# Patient Record
Sex: Female | Born: 1935 | Race: Black or African American | Hispanic: No | Smoking: Former smoker
Health system: Southern US, Community
[De-identification: ages and names within clinical notes are randomized; demographics above are authoritative.]

## PROBLEM LIST (undated history)

## (undated) DIAGNOSIS — F0390 Unspecified dementia without behavioral disturbance: Secondary | ICD-10-CM

## (undated) DIAGNOSIS — I639 Cerebral infarction, unspecified: Secondary | ICD-10-CM

---

## 2016-03-19 ENCOUNTER — Inpatient Hospital Stay (HOSPITAL_COMMUNITY): Payer: Medicare HMO

## 2016-03-19 ENCOUNTER — Emergency Department (HOSPITAL_COMMUNITY): Payer: Medicare HMO

## 2016-03-19 ENCOUNTER — Inpatient Hospital Stay (HOSPITAL_COMMUNITY)
Admission: EM | Admit: 2016-03-19 | Discharge: 2016-03-23 | DRG: 064 | Disposition: A | Discharge status: Hospice | Payer: Medicare HMO | Attending: Family Medicine | Admitting: Family Medicine

## 2016-03-19 DIAGNOSIS — J439 Emphysema, unspecified: Secondary | ICD-10-CM | POA: Diagnosis present

## 2016-03-19 DIAGNOSIS — I459 Conduction disorder, unspecified: Secondary | ICD-10-CM | POA: Diagnosis present

## 2016-03-19 DIAGNOSIS — N390 Urinary tract infection, site not specified: Secondary | ICD-10-CM | POA: Diagnosis present

## 2016-03-19 DIAGNOSIS — I10 Essential (primary) hypertension: Secondary | ICD-10-CM | POA: Diagnosis present

## 2016-03-19 DIAGNOSIS — Z681 Body mass index (BMI) 19 or less, adult: Secondary | ICD-10-CM

## 2016-03-19 DIAGNOSIS — I671 Cerebral aneurysm, nonruptured: Secondary | ICD-10-CM | POA: Diagnosis present

## 2016-03-19 DIAGNOSIS — Z515 Encounter for palliative care: Secondary | ICD-10-CM | POA: Diagnosis present

## 2016-03-19 DIAGNOSIS — R64 Cachexia: Secondary | ICD-10-CM | POA: Diagnosis present

## 2016-03-19 DIAGNOSIS — E43 Unspecified severe protein-calorie malnutrition: Secondary | ICD-10-CM | POA: Diagnosis present

## 2016-03-19 DIAGNOSIS — R05 Cough: Secondary | ICD-10-CM

## 2016-03-19 DIAGNOSIS — I739 Peripheral vascular disease, unspecified: Secondary | ICD-10-CM | POA: Diagnosis present

## 2016-03-19 DIAGNOSIS — G300 Alzheimer's disease with early onset: Secondary | ICD-10-CM

## 2016-03-19 DIAGNOSIS — E785 Hyperlipidemia, unspecified: Secondary | ICD-10-CM | POA: Diagnosis not present

## 2016-03-19 DIAGNOSIS — R1313 Dysphagia, pharyngeal phase: Secondary | ICD-10-CM | POA: Diagnosis present

## 2016-03-19 DIAGNOSIS — R531 Weakness: Secondary | ICD-10-CM

## 2016-03-19 DIAGNOSIS — Z7401 Bed confinement status: Secondary | ICD-10-CM

## 2016-03-19 DIAGNOSIS — E876 Hypokalemia: Secondary | ICD-10-CM | POA: Diagnosis present

## 2016-03-19 DIAGNOSIS — R29810 Facial weakness: Secondary | ICD-10-CM | POA: Diagnosis present

## 2016-03-19 DIAGNOSIS — Z8249 Family history of ischemic heart disease and other diseases of the circulatory system: Secondary | ICD-10-CM | POA: Diagnosis not present

## 2016-03-19 DIAGNOSIS — G309 Alzheimer's disease, unspecified: Secondary | ICD-10-CM | POA: Diagnosis present

## 2016-03-19 DIAGNOSIS — I63311 Cerebral infarction due to thrombosis of right middle cerebral artery: Secondary | ICD-10-CM | POA: Diagnosis not present

## 2016-03-19 DIAGNOSIS — F17210 Nicotine dependence, cigarettes, uncomplicated: Secondary | ICD-10-CM | POA: Diagnosis present

## 2016-03-19 DIAGNOSIS — G8194 Hemiplegia, unspecified affecting left nondominant side: Secondary | ICD-10-CM | POA: Diagnosis present

## 2016-03-19 DIAGNOSIS — Z66 Do not resuscitate: Secondary | ICD-10-CM | POA: Diagnosis present

## 2016-03-19 DIAGNOSIS — I639 Cerebral infarction, unspecified: Secondary | ICD-10-CM | POA: Diagnosis present

## 2016-03-19 DIAGNOSIS — I672 Cerebral atherosclerosis: Secondary | ICD-10-CM | POA: Diagnosis present

## 2016-03-19 DIAGNOSIS — R059 Cough, unspecified: Secondary | ICD-10-CM

## 2016-03-19 DIAGNOSIS — F0280 Dementia in other diseases classified elsewhere without behavioral disturbance: Secondary | ICD-10-CM | POA: Diagnosis present

## 2016-03-19 HISTORY — DX: Cerebral infarction, unspecified: I63.9

## 2016-03-19 HISTORY — DX: Unspecified dementia, unspecified severity, without behavioral disturbance, psychotic disturbance, mood disturbance, and anxiety: F03.90

## 2016-03-19 LAB — COMPREHENSIVE METABOLIC PANEL
ALK PHOS: 69 U/L (ref 38–126)
ALT: UNDETERMINED U/L (ref 14–54)
AST: UNDETERMINED U/L (ref 15–41)
Albumin: 3.9 g/dL (ref 3.5–5.0)
Anion gap: 9 (ref 5–15)
BUN: 15 mg/dL (ref 6–20)
CHLORIDE: 107 mmol/L (ref 101–111)
CO2: 22 mmol/L (ref 22–32)
CREATININE: 0.65 mg/dL (ref 0.44–1.00)
Calcium: 9.9 mg/dL (ref 8.9–10.3)
GFR calc Af Amer: >60 mL/min (ref >60)
Glucose, Bld: 94 mg/dL (ref 65–99)
Potassium: 3.3 mmol/L — ABNORMAL LOW (ref 3.5–5.1)
Sodium: 138 mmol/L (ref 135–145)
Total Bilirubin: UNDETERMINED mg/dL (ref 0.3–1.2)
Total Protein: 6.7 g/dL (ref 6.5–8.1)

## 2016-03-19 LAB — I-STAT CHEM 8, ED
BUN: 18 mg/dL (ref 6–20)
CREATININE: 0.6 mg/dL (ref 0.44–1.00)
Calcium, Ion: 1.29 mmol/L (ref 1.15–1.40)
Chloride: 104 mmol/L (ref 101–111)
Glucose, Bld: 96 mg/dL (ref 65–99)
HEMATOCRIT: 40 % (ref 36.0–46.0)
Hemoglobin: 13.6 g/dL (ref 12.0–15.0)
POTASSIUM: 3.3 mmol/L — AB (ref 3.5–5.1)
Sodium: 141 mmol/L (ref 135–145)
TCO2: 27 mmol/L (ref 0–100)

## 2016-03-19 LAB — CBC
HEMATOCRIT: 36.9 % (ref 36.0–46.0)
HEMOGLOBIN: 12.5 g/dL (ref 12.0–15.0)
MCH: 30.2 pg (ref 26.0–34.0)
MCHC: 33.9 g/dL (ref 30.0–36.0)
MCV: 89.1 fL (ref 78.0–100.0)
Platelets: 150 10*3/uL (ref 150–400)
RBC: 4.14 MIL/uL (ref 3.87–5.11)
RDW: 13.9 % (ref 11.5–15.5)
WBC: 7.2 10*3/uL (ref 4.0–10.5)

## 2016-03-19 LAB — URINALYSIS, DIPSTICK ONLY
Bilirubin Urine: NEGATIVE
GLUCOSE, UA: NEGATIVE mg/dL
Ketones, ur: NEGATIVE mg/dL
Nitrite: POSITIVE — AB
PH: 8 (ref 5.0–8.0)
Protein, ur: NEGATIVE mg/dL
Specific Gravity, Urine: 1.01 (ref 1.005–1.030)

## 2016-03-19 LAB — DIFFERENTIAL
BASOS ABS: 0 10*3/uL (ref 0.0–0.1)
Basophils Relative: 0 %
Eosinophils Absolute: 0.1 10*3/uL (ref 0.0–0.7)
Eosinophils Relative: 1 %
LYMPHS ABS: 2.6 10*3/uL (ref 0.7–4.0)
LYMPHS PCT: 37 %
Monocytes Absolute: 0.6 10*3/uL (ref 0.1–1.0)
Monocytes Relative: 9 %
NEUTROS ABS: 3.9 10*3/uL (ref 1.7–7.7)
Neutrophils Relative %: 53 %

## 2016-03-19 LAB — I-STAT TROPONIN, ED: TROPONIN I, POC: 0 ng/mL (ref 0.00–0.08)

## 2016-03-19 LAB — CBG MONITORING, ED
GLUCOSE-CAPILLARY: 87 mg/dL (ref 65–99)
Glucose-Capillary: 91 mg/dL (ref 65–99)

## 2016-03-19 LAB — PROTIME-INR
INR: 1.06
Prothrombin Time: 13.8 seconds (ref 11.4–15.2)

## 2016-03-19 LAB — APTT: APTT: 24 s (ref 24–36)

## 2016-03-19 MED ORDER — POTASSIUM CHLORIDE 2 MEQ/ML IV SOLN
INTRAVENOUS | Status: DC
Start: 2016-03-19 — End: 2016-03-19

## 2016-03-19 MED ORDER — ACETAMINOPHEN 325 MG PO TABS: 650.0000 mg | ORAL_TABLET | Freq: Four times a day (QID) | ORAL | Status: DC | PRN

## 2016-03-19 MED ORDER — STROKE: EARLY STAGES OF RECOVERY BOOK
Freq: Once | Status: AC
  Administered 2016-03-19: 1
  Filled 2016-03-19: qty 1

## 2016-03-19 MED ORDER — ASPIRIN 300 MG RE SUPP
300.0000 mg | Freq: Every day | RECTAL | Status: DC
  Administered 2016-03-19 – 2016-03-20 (×2): 300 mg via RECTAL
  Filled 2016-03-19 (×2): qty 1

## 2016-03-19 MED ORDER — METOPROLOL TARTRATE 5 MG/5ML IV SOLN
5.0000 mg | INTRAVENOUS | Status: DC | PRN
  Administered 2016-03-20 – 2016-03-21 (×2): 5 mg via INTRAVENOUS
  Filled 2016-03-19 (×2): qty 5

## 2016-03-19 MED ORDER — HEPARIN SODIUM (PORCINE) 5000 UNIT/ML IJ SOLN
5000.0000 [IU] | Freq: Three times a day (TID) | INTRAMUSCULAR | Status: DC
  Administered 2016-03-19 – 2016-03-21 (×5): 5000 [IU] via SUBCUTANEOUS
  Filled 2016-03-19 (×5): qty 1

## 2016-03-19 MED ORDER — ASPIRIN 325 MG PO TABS
325.0000 mg | ORAL_TABLET | Freq: Every day | ORAL | Status: DC
  Administered 2016-03-21: 325 mg via ORAL
  Filled 2016-03-19: qty 1

## 2016-03-19 MED ORDER — KCL IN DEXTROSE-NACL 20-5-0.45 MEQ/L-%-% IV SOLN
INTRAVENOUS | Status: DC
  Administered 2016-03-19: 22:00:00 via INTRAVENOUS
  Filled 2016-03-19 (×2): qty 1000

## 2016-03-19 MED ORDER — IOPAMIDOL (ISOVUE-370) INJECTION 76%
INTRAVENOUS | Status: AC
  Administered 2016-03-19: 50 mL
  Filled 2016-03-19: qty 100

## 2016-03-19 NOTE — Code Documentation (Signed)
83 y.o. female w/ PMH of dementia presents to [facility] via [facility] as a code stroke. Pt from home where she lives with her family. EMS stated she needs assistance with her ADL's and cannot ambulate independently. The pt was stated to wake up around 0600 today in her usual state of health. Around 0730 the family reports the pt to have left sided weakness, left facial droop and not following commands. CT negative for acute abnormality. tPA not given d/t being out of the window. CTA showing no LVO. Not an IR candidate.  NIHSS 18. See EMR for NIHSS and code stroke times. On assessment, pt with incomprehensible speech, BLE contracted, LUE contracted and left facial droop. Bedside handoff with ED RN Liz BeachGabe

## 2016-03-19 NOTE — ED Provider Notes (Addendum)
[facility]-EMERGENCY DEPT Provider Note   CSN: [id number] Arrival date & time: 03/19/16  1317   An emergency department physician performed an initial assessment on this suspected stroke patient at 1317.  History   Chief Complaint Chief Complaint  Patient presents with  . Code Stroke   Level V caveat: The patient's unable to speak or communicate, there are no family members at the bedside HPI Debbie Cisneros is a 83 y.o. female.  HPI Patient presents to the emergency room as a possible code stroke. According to the nursing report, patient was last seen normal at 0 7:30 this morning. Family checked on her at some other time and noticed that she was less communicative and also not moving her left side.  There was also mention of a left facial droop.  According to nursing report, at baseline the patient requires maximum assistance carry on her activities of daily living.  She is immobile.    No past medical history on file.  There are no active problems to display for this patient.   No past surgical history on file.  OB History    No data available       Home Medications    Prior to Admission medications   Not on File    Family History No family history on file.  Social History Social History  Substance Use Topics  . Smoking status: Not on file  . Smokeless tobacco: Not on file  . Alcohol use Not on file     Allergies   Patient has no allergy information on record.   Review of Systems Review of Systems  Unable to perform ROS: Patient nonverbal     Physical Exam Updated Vital Signs BP 190/90   Temp (!) 95.4 F (35.2 C) (Oral)   Resp 19   SpO2 96%   Physical Exam  Constitutional: No distress.  Frail, elderly  HENT:  Head: Normocephalic and atraumatic.  Right Ear: External ear normal.  Left Ear: External ear normal.  Eyes: Conjunctivae are normal. Right eye exhibits no discharge. Left eye exhibits no discharge. No scleral icterus.  Neck: Neck supple. No  tracheal deviation present.  Cardiovascular: Normal rate, regular rhythm and intact distal pulses.   Pulmonary/Chest: Effort normal and breath sounds normal. No stridor. No respiratory distress. She has no wheezes. She has no rales.  Abdominal: Soft. Bowel sounds are normal. She exhibits no distension. There is no tenderness. There is no rebound and no guarding.  Musculoskeletal: She exhibits no edema or tenderness.  Neurological: She is alert. She displays atrophy. She displays no tremor. No cranial nerve deficit (no facial droop, extraocular movements intact, no slurred speech) or sensory deficit. She exhibits normal muscle tone. She displays no seizure activity. Coordination abnormal. GCS eye subscore is 4. GCS verbal subscore is 2. GCS motor subscore is 6.  Pt will wiggle fingers on both hands, legs held in flexion, mumbles when spoken to but no comprehensible words  Skin: Skin is warm and dry. No rash noted.  Psychiatric: She has a normal mood and affect.  Nursing note and vitals reviewed.    ED Treatments / Results  Labs (all labs ordered are listed, but only abnormal results are displayed) Labs Reviewed  COMPREHENSIVE METABOLIC PANEL - Abnormal; Notable for the following:       Result Value   Potassium 3.3 (*)    All other components within normal limits  I-STAT CHEM 8, ED - Abnormal; Notable for the following:  Potassium 3.3 (*)    All other components within normal limits  PROTIME-INR  APTT  CBC  DIFFERENTIAL  URINALYSIS, DIPSTICK ONLY  I-STAT TROPOININ, ED  CBG MONITORING, ED    EKG  EKG Interpretation None       Radiology Ct Angio Head W Or Wo Contrast  Result Date: 03/19/2016 CLINICAL DATA:  Left-sided rigidity.  Left-sided facial droop. EXAM: CT ANGIOGRAPHY HEAD AND NECK TECHNIQUE: Multidetector CT imaging of the head and neck was performed using the standard protocol during bolus administration of intravenous contrast. Multiplanar CT image reconstructions and  MIPs were obtained to evaluate the vascular anatomy. Carotid stenosis measurements (when applicable) are obtained utilizing NASCET criteria, using the distal internal carotid diameter as the denominator. CONTRAST:  50 cc Isovue 370 intravenous COMPARISON:  Head CT from earlier today FINDINGS: CTA NECK FINDINGS Aortic arch: 4 vessel branching with aberrant right subclavian artery. Atherosclerosis. No acute finding. Right carotid system: Atherosclerotic plaque mainly at the common carotid bifurcation. Proximal ICA tortuosity and kinking. No flow limiting stenosis or dissection. Negative for ulceration. Left carotid system: Atherosclerotic plaque mainly at the common carotid bifurcation without flow limiting stenosis. Posterior outpouching at the proximal ICA bulb is likely atheromatous ulceration. Negative for dissection. Vertebral arteries: No flow limiting stenosis in the proximal subclavian arteries. Mild bilateral subclavian artery atherosclerotic narrowing proximal to the first rib crossings. Left dominant system. Both vessels are patent to the dura without signs of dissection. Skeleton: No acute finding. Diffuse cervical spine degeneration with reversed lordosis. Other neck: Negative for incidental mass lesion. Upper chest: Centrilobular and panlobular emphysema. Granulomatous type calcifications in the thoracic nodes. Review of the MIP images confirms the above findings CTA HEAD FINDINGS Anterior circulation: Symmetric carotid artery. No reversible flow limiting stenosis. Multifocal bilateral advanced MCA branch narrowing, most proximally along a left M2 branch. Extensive atheromatous type narrowings of the bilateral A2 and distal branches. Narrowings are marked on thick mips. 3 mm superiorly directed A-comm region aneurysm. 2 mm infundibulum or aneurysm posteriorly from the supraclinoid right ICA. Posterior circulation: Left vertebral artery dominance. The right vertebral artery ends in PICA. Hypoplastic left  P1 segment. Advanced atherosclerotic irregularity and multifocal narrowing of the bilateral PCAs, more advanced and proximal on the right at the P2 segment. Negative for aneurysm. Narrowings are marked on thick mips. Venous sinuses: Patent Anatomic variants: As described above Delayed phase: Not obtained in the emergent setting Text page with results were sent 03/19/2016 at 2:07 pm to Dr. Ritta Slot . Review of the MIP images confirms the above findings IMPRESSION: 1. No emergent large vessel occlusion. 2. Intracranial atherosclerosis with multifocal advanced narrowing of medium size vessels in all major vascular territories. 3. Atherosclerosis in the neck without flow limiting stenosis. Outpouching at the left ICA bulb is likely atheromatous ulceration. 4. 3 mm anterior communicating artery aneurysm. 2 mm infundibulum or aneurysm of the right supraclinoid ICA. 5. Atrophy and advanced chronic microvascular disease. 6. Emphysema. Electronically Signed   By: Marnee Spring M.D.   On: 03/19/2016 14:08   Ct Head Wo Contrast  Result Date: 03/19/2016 CLINICAL DATA:  Left sided facial droop, nonverbal EXAM: CT HEAD WITHOUT CONTRAST TECHNIQUE: Contiguous axial images were obtained from the base of the skull through the vertex without intravenous contrast. COMPARISON:  None. FINDINGS: Brain: The ventricles are significantly enlarged, and there prominent cortical sulci, findings which are consistent with diffuse atrophy. The septum is midline in position. Moderate small vessel ischemic change is noted throughout the left  ventricle white matter. There appear to be several small lacunar infarcts which are old on the right. No hemorrhage, mass lesion, or acute infarction is seen. Vascular: No vascular abnormality is noted on this unenhanced study. Skull: On bone window images, no calvarial abnormality is noted. Sinuses/Orbits: The paranasal sinuses are well pneumatized. Other: None. IMPRESSION: Diffuse atrophy and  moderate small vessel ischemic change. No acute intracranial abnormality. Electronically Signed   By: Dwyane Dee M.D.   On: 03/19/2016 13:33   Ct Angio Neck W Or Wo Contrast  Result Date: 03/19/2016 CLINICAL DATA:  Left-sided rigidity.  Left-sided facial droop. EXAM: CT ANGIOGRAPHY HEAD AND NECK TECHNIQUE: Multidetector CT imaging of the head and neck was performed using the standard protocol during bolus administration of intravenous contrast. Multiplanar CT image reconstructions and MIPs were obtained to evaluate the vascular anatomy. Carotid stenosis measurements (when applicable) are obtained utilizing NASCET criteria, using the distal internal carotid diameter as the denominator. CONTRAST:  50 cc Isovue 370 intravenous COMPARISON:  Head CT from earlier today FINDINGS: CTA NECK FINDINGS Aortic arch: 4 vessel branching with aberrant right subclavian artery. Atherosclerosis. No acute finding. Right carotid system: Atherosclerotic plaque mainly at the common carotid bifurcation. Proximal ICA tortuosity and kinking. No flow limiting stenosis or dissection. Negative for ulceration. Left carotid system: Atherosclerotic plaque mainly at the common carotid bifurcation without flow limiting stenosis. Posterior outpouching at the proximal ICA bulb is likely atheromatous ulceration. Negative for dissection. Vertebral arteries: No flow limiting stenosis in the proximal subclavian arteries. Mild bilateral subclavian artery atherosclerotic narrowing proximal to the first rib crossings. Left dominant system. Both vessels are patent to the dura without signs of dissection. Skeleton: No acute finding. Diffuse cervical spine degeneration with reversed lordosis. Other neck: Negative for incidental mass lesion. Upper chest: Centrilobular and panlobular emphysema. Granulomatous type calcifications in the thoracic nodes. Review of the MIP images confirms the above findings CTA HEAD FINDINGS Anterior circulation: Symmetric carotid  artery. No reversible flow limiting stenosis. Multifocal bilateral advanced MCA branch narrowing, most proximally along a left M2 branch. Extensive atheromatous type narrowings of the bilateral A2 and distal branches. Narrowings are marked on thick mips. 3 mm superiorly directed A-comm region aneurysm. 2 mm infundibulum or aneurysm posteriorly from the supraclinoid right ICA. Posterior circulation: Left vertebral artery dominance. The right vertebral artery ends in PICA. Hypoplastic left P1 segment. Advanced atherosclerotic irregularity and multifocal narrowing of the bilateral PCAs, more advanced and proximal on the right at the P2 segment. Negative for aneurysm. Narrowings are marked on thick mips. Venous sinuses: Patent Anatomic variants: As described above Delayed phase: Not obtained in the emergent setting Text page with results were sent 03/19/2016 at 2:07 pm to Dr. Ritta Slot . Review of the MIP images confirms the above findings IMPRESSION: 1. No emergent large vessel occlusion. 2. Intracranial atherosclerosis with multifocal advanced narrowing of medium size vessels in all major vascular territories. 3. Atherosclerosis in the neck without flow limiting stenosis. Outpouching at the left ICA bulb is likely atheromatous ulceration. 4. 3 mm anterior communicating artery aneurysm. 2 mm infundibulum or aneurysm of the right supraclinoid ICA. 5. Atrophy and advanced chronic microvascular disease. 6. Emphysema. Electronically Signed   By: Marnee Spring M.D.   On: 03/19/2016 14:08    Procedures Procedures (including critical care time)  Medications Ordered in ED Medications  iopamidol (ISOVUE-370) 76 % injection (50 mLs  Contrast Given 03/19/16 1332)     Initial Impression / Assessment and Plan / ED Course  I have reviewed the triage vital signs and the nursing notes.  Pertinent labs & imaging results that were available during my care of the patient were reviewed by me and considered in my  medical decision making (see chart for details).  Clinical Course as of Mar 20 1442  Thu Mar 19, 2016  1432 Family is now at the bedside.  I discussed the case with Dr Amada Jupiter. He suspects she did have a stroke.  Will consult for admission, further treatment, stroke evaluation  [JK]    Clinical Course User Index [JK] Linwood Dibbles, MD   Pt was brought in as a possible code stroke.  Not a TPA candidate per the stroke team.  Plan on admission, further evaluation.   Final Clinical Impressions(s) / ED Diagnoses   Final diagnoses:  Left-sided weakness  Left-sided weakness  Stroke (cerebrum) (HCC)      Linwood Dibbles, MD 03/19/16 1413    Linwood Dibbles, MD 03/19/16 1444

## 2016-03-19 NOTE — Progress Notes (Signed)
EEG completed, results pending. 

## 2016-03-19 NOTE — ED Notes (Signed)
Transported to EEG

## 2016-03-19 NOTE — Procedures (Signed)
History: 83 year old female with left-sided weakness  Sedation: None  Technique: This is a 21 channel routine scalp EEG performed at the bedside with bipolar and monopolar montages arranged in accordance to the international 10/20 system of electrode placement. One channel was dedicated to EKG recording.    Background: The background consists of generalized irregular delta and theta activities. There is a posterior dominant rhythm of 7 -8 Hz. sleep is not recorded.  Photic stimulation: Physiologic driving is not performed  EEG Abnormalities: 1) generalized irregular slow activity 2) slow PDR  Clinical Interpretation: This EEG is consistent with a generalized nonspecific cerebral dysfunction (encephalopathy) as can be seen in dementia among other etiologies. There was no seizure or seizure predisposition recorded on this study. Please note that a normal EEG does not preclude the possibility of epilepsy.   Debbie SlotMcNeill [name], MD [facility] [phone]  If 7pm- 7am, please page neurology on call as listed in AMION.

## 2016-03-19 NOTE — Consult Note (Signed)
Requesting Physician: Dr. Lynelle Doctor    Chief Complaint: stroke  History obtained from:  EMS  HPI:                                                                                                                                         Debbie Cisneros is an 83 y.o. female who has dementia at baseline. Lives with family, is immobile and cannot take care of her ADL's.  She was seen at her baseline this AM at 0600 but at 0730 she was noted to have left arm facial droop, and left arm flaccidity. EMS was called about 1230 due to patient not being herself and she was brought to [facility] has a code stroke. CT head showed no acute bleed or stroke. CTA head showed no large vessel occlusion. Family was not present for some time. No number was in chart to get ahold of family.  Date last known well: Date: 03/19/2016 Time last known well: Time: 06:00 tPA Given: No: out of window           Modified Rankin: Rankin Score=4   No past medical history on file.  No past surgical history on file.  No family history on file. Social History:  has no tobacco, alcohol, and drug history on file.  Allergies: Allergies not on file  Medications:                                                                                                                           none  ROS:                                                                                                                                       History obtained from unobtainable from patient due to mental  status and non-verbal  Neurologic Examination:                                                                                                      There were no vitals taken for this visit.  HEENT-  Normocephalic, no lesions, without obvious abnormality.  Normal external eye and conjunctiva.  Normal TM's bilaterally.  Normal auditory canals and external ears. Normal external nose, mucus membranes and septum.  Normal pharynx. Cardiovascular- S1, S2 normal,  pulses palpable throughout   Lungs- chest clear, no wheezing, rales, normal symmetric air entry Abdomen- normal findings: bowel sounds normal Extremities- no edema Lymph-no adenopathy palpable Musculoskeletal-no joint tenderness, deformity or swelling Skin-warm and dry, no hyperpigmentation, vitiligo, or suspicious lesions  Neurological Examination Mental Status: Non verbal and follows no commands. Moans to movement of extremities Cranial Nerves: II: blinks to threat bilaterally III,IV, VI: doll's intact bilaterally V,VII: left facial droop, winces to tactile stimuli bilaterally.   VIII: looks to voice  Motor: Right arm moves 4/5 strength left arm is contracted at elbow at 45 degrees with increased tone but able to lift off bed at shoulder. Both legs held in flexion at 45 degrees with increased tone.  Sensory: Pinprick and light touch intact throughout, bilaterally Deep Tendon Reflexes: minimal in UE due to increased tone. No KJ or AJ bilaterally Plantars: Right: downgoing   Left: downgoing Cerebellar: Unable to test Gait:not tested       Lab Results: Basic Metabolic Panel:  Recent Labs Lab 03/19/16 1324  NA 141  K 3.3*  CL 104  GLUCOSE 96  BUN 18  CREATININE 0.60    Liver Function Tests: No results for input(s): AST, ALT, ALKPHOS, BILITOT, PROT, ALBUMIN in the last 168 hours. No results for input(s): LIPASE, AMYLASE in the last 168 hours. No results for input(s): AMMONIA in the last 168 hours.  CBC:  Recent Labs Lab 03/19/16 1318 03/19/16 1324  WBC 7.2  --   NEUTROABS 3.9  --   HGB 12.5 13.6  HCT 36.9 40.0  MCV 89.1  --   PLT 150  --     Cardiac Enzymes: No results for input(s): CKTOTAL, CKMB, CKMBINDEX, TROPONINI in the last 168 hours.  Lipid Panel: No results for input(s): CHOL, TRIG, HDL, CHOLHDL, VLDL, LDLCALC in the last 168 hours.  CBG:  Recent Labs Lab 03/19/16 1318  GLUCAP 91    Microbiology: No results found for this or any  previous visit.  Coagulation Studies:  Recent Labs  03/19/16 1318  LABPROT 13.8  INR 1.06    Imaging: Ct Head Wo Contrast  Result Date: 03/19/2016 CLINICAL DATA:  Left sided facial droop, nonverbal EXAM: CT HEAD WITHOUT CONTRAST TECHNIQUE: Contiguous axial images were obtained from the base of the skull through the vertex without intravenous contrast. COMPARISON:  None. FINDINGS: Brain: The ventricles are significantly enlarged, and there prominent cortical sulci, findings which are consistent with diffuse atrophy. The septum is midline in position. Moderate small vessel ischemic change is noted throughout the left ventricle white matter. There appear to be several  small lacunar infarcts which are old on the right. No hemorrhage, mass lesion, or acute infarction is seen. Vascular: No vascular abnormality is noted on this unenhanced study. Skull: On bone window images, no calvarial abnormality is noted. Sinuses/Orbits: The paranasal sinuses are well pneumatized. Other: None. IMPRESSION: Diffuse atrophy and moderate small vessel ischemic change. No acute intracranial abnormality. Electronically Signed   By: Dwyane DeePaul  Barry M.D.   On: 03/19/2016 13:33       Assessment and plan discussed with with attending physician and they are in agreement.    Felicie MornDavid Smith PA-C [facility] [phone]  03/19/2016, 1:43 PM   I have Seen and evaluated the patient. At baseline, she is able to feed herself finger foods, but not use a spoon. She is in a wheelchair or bed.  I discussed CODE STATUS and aggressiveness of workup with the family. They agree that she would not want to be resuscitated if she were to stop breathing or have her heart stop.  As far as care short of this, they are not ready to not perform workup they could prevent strokes.  Assessment: 83 y.o. female with a history of fairly advanced dementia who presents with new left arm and face weakness with some increase in tone. I suspect  that she has had an ischemic infarct, though with the increase in tone, and EEG was performed which did rule out ongoing seizure.  Stroke Risk Factors - none  1. HgbA1c, fasting lipid panel 2. MRI, MRA  of the brain without contrast 3. Frequent neuro checks 4. Echocardiogram 5. Carotid dopplers 6. Prophylactic therapy-Antiplatelet med: Aspirin - dose 325mg  PO or 300mg  PR 7. Risk factor modification 8. Telemetry monitoring 9. PT consult, OT consult, Speech consult 10. please page stroke NP  Or  PA  Or MD  from 8am -4 pm starting 3/2 as this patient will be followed by the stroke team at this point.   You can look them up on [email or web address]    Ritta SlotMcNeill Neddie Steedman, MD [facility] [phone]  If 7pm- 7am, please page neurology on call as listed in AMION.

## 2016-03-19 NOTE — H&P (Signed)
Family Medicine Teaching Service Hospital Admission History and Physical Service Pager: [phone]  [facility] Medical record number: [id number] Date of birth: Mar 20, 1930 Age: 83 y.o. Gender: female  Primary Care Provider: Pcp Not In System Consultants: neurology Code Status: FULL per daughter  Chief Complaint: L facial droop and left arm flaccidity  Assessment and Plan: Debbie Cisneros is a 83 y.o. female presenting with L facial droop and L arm flaccidity via EMS. PMH is significant for Alzheimer's dementia, hx tobacco and alcohol abuse (quit 10 years ago).  Left facial droop and left arm weakness: Daughter denies hx of risk factors other than long term tobacco use (no HTN, CHF, afib diagnoses). Patient with acute onset of facial droop and L arm weakness this morning. Code Stroke called, patient found to be out of TPA window. CT head without signs of bleeding. CTA head with diffuse intracranial atherosclerosis. MRI ordered.  - admit to tele INPATIENT, attending Dr. Leveda AnnaHensel - neurology following, appreciate recs - HgbA1C, TSH, lipid panel in am - carotid US  - EEG per neurology  - echo  - Start aspirin 325mg  - PT/OT/SLP eval and treat (patient failed bedside swallow) - permissive hypertension x 24 hours, metoprolol 5mg  q4h PRN for SBP >220, DBP >120 - Started goals of care discussion with family on admission. Patient with end stage dementia and is essentially bed bound. Patient's daughter would like to discuss goals of care with other family members. Consider palliative care consult in the AM.  Hypokalemia: K 3.3 on admission. - 20mEq KCl added to IVFs - Check magnesium in the morning  Abnormal UA: UA in the ED with large leukocytes and positive nitrites - Pt does not meet McGreer's criteria for UTI - Will not start antibiotics   Alzheimer's dementia: Diagnosed at age 83, per daughter. Patient bed bound at home.  - Previously on Aricept years ago, no longer taking  this. - patient wears dentures, will have daughter bring these for orientation - counseled family on being with patient as much as possible to avoid delirium   Protein-calorie malnutrition: Patient frail and cachetic-appearing on exam with diffuse muscle atrophy. - Can consider nutrition consult when patient is taking PO  FEN/GI: NPO pending bedside swallow test. MIVF with D51/2NS w 20meQ KCl at 80cc/hr Prophylaxis: Heparin sq  Disposition: admit to inpatient  History of Present Illness:  Debbie Cisneros is a 83 y.o. female presenting with L facial droop and L arm flaccidity via EMS. Last known normal at 0700 today, with facial droop noted at 1100. EMS called at 1230. In ED, code stroke was called and neurology consulted. CT head without signs of bleeding. MRI ordered.   Patient was acting completely like her normal self yesterday evening. This morning before 11AM, daughter thought she was asleep but then she noticed that she had tears coming out of her eyes, which was unusual for her. Daughter then noticed that she wouldn't eat. She also wasn't making the noises that she normally does. She then became a little more alert. Daughter gave her some water, but she became "a little strangled" with drinking and wasn't able to cough as well as normal. Her granddaughter came over and thought that "her mouth looked twisted". They also noticed that she couldn't grip anything with her left hand. They called 911 because they were worried she was having a stroke.  Has been able to feed herself up until recently, but now requires full assistance. She is primarily non-ambulatory, but can stand for  a short period of time. She is in bed or sitting in her recliner. She stopped walking a year ago. She needs full assistance with bathing. She lives at home with her two daughters.  Review Of Systems: Per HPI with the following additions: ROS limited due to patient's dementia. History and ROS provided by daughter.  Review  of Systems  Constitutional: Negative for fever.  HENT: Negative for congestion.   Respiratory: Negative for cough and shortness of breath.   Gastrointestinal: Negative for blood in stool and diarrhea.  Genitourinary: Negative for frequency, hematuria and urgency.  Musculoskeletal: Negative for falls.  Neurological: Negative for seizures.    Patient Active Problem List   Diagnosis Date Noted  . Left-sided weakness 03/19/2016    Past Medical History: Alzheimer's dementia Hx tobacco abuse  Past Surgical History: - R 5th toe amputation in early 90s for work accident - Appendectomy in the 60s.  Social History: Social History  Substance Use Topics  . Smoking status: Not on file  . Smokeless tobacco: Not on file  . Alcohol use Not on file   Additional social history: lives at home with family. Smokes half a pack a day for many, many years. She used to drink many malt liquor beers per day (daughter is unsure how many drinks she would have per day, but this went on for many years). Quit drinking 10 years ago. No drug use.  Please also refer to relevant sections of EMR.  Family History: Mother- HTN Brother- prostate cancer, heart attack  Allergies and Medications: Not on File No current facility-administered medications on file prior to encounter.    No current outpatient prescriptions on file prior to encounter.    Objective: BP (!) 178/118 (BP Location: Right Arm)   Pulse 90   Temp 98.3 F (36.8 C) (Rectal)   Resp 18   Ht 5\' 3"  (1.6 m)   Wt 97 lb (44 kg)   SpO2 96%   BMI 17.18 kg/m  Exam: General: Cachectic female lying bed in NAD. Tracks with eyes. Able to say yes and no. Eyes: EOMI ENTM: somewhat dry mucous membranes Neck: supple, no JVD Cardiovascular: RRR, no murmur Respiratory: CTAB Gastrointestinal: SNTND, +BS, no masses or rebound MSK: thin extremities, minimal muscle mass. Moves RUE spontaneously, LUE contracted in flexion. Bilateral LEs contracted.   Derm: no rashes or wounds visualized Neuro: patient unable to cooperate with commands for neuro exam. EOMI, responds yes to light touch. Unable to extend LUE actively.  Psych: pleasant.   Labs and Imaging: CBC BMET   Recent Labs Lab 03/19/16 1318 03/19/16 1324  WBC 7.2  --   HGB 12.5 13.6  HCT 36.9 40.0  PLT 150  --     Recent Labs Lab 03/19/16 1318 03/19/16 1324  NA 138 141  K 3.3* 3.3*  CL 107 104  CO2 22  --   BUN 15 18  CREATININE 0.65 0.60  GLUCOSE 94 96  CALCIUM 9.9  --      Ct Angio Head W Or Wo Contrast  Result Date: 03/19/2016 CLINICAL DATA:  Left-sided rigidity.  Left-sided facial droop. EXAM: CT ANGIOGRAPHY HEAD AND NECK TECHNIQUE: Multidetector CT imaging of the head and neck was performed using the standard protocol during bolus administration of intravenous contrast. Multiplanar CT image reconstructions and MIPs were obtained to evaluate the vascular anatomy. Carotid stenosis measurements (when applicable) are obtained utilizing NASCET criteria, using the distal internal carotid diameter as the denominator. CONTRAST:  50 cc Isovue  370 intravenous COMPARISON:  Head CT from earlier today FINDINGS: CTA NECK FINDINGS Aortic arch: 4 vessel branching with aberrant right subclavian artery. Atherosclerosis. No acute finding. Right carotid system: Atherosclerotic plaque mainly at the common carotid bifurcation. Proximal ICA tortuosity and kinking. No flow limiting stenosis or dissection. Negative for ulceration. Left carotid system: Atherosclerotic plaque mainly at the common carotid bifurcation without flow limiting stenosis. Posterior outpouching at the proximal ICA bulb is likely atheromatous ulceration. Negative for dissection. Vertebral arteries: No flow limiting stenosis in the proximal subclavian arteries. Mild bilateral subclavian artery atherosclerotic narrowing proximal to the first rib crossings. Left dominant system. Both vessels are patent to the dura without signs  of dissection. Skeleton: No acute finding. Diffuse cervical spine degeneration with reversed lordosis. Other neck: Negative for incidental mass lesion. Upper chest: Centrilobular and panlobular emphysema. Granulomatous type calcifications in the thoracic nodes. Review of the MIP images confirms the above findings CTA HEAD FINDINGS Anterior circulation: Symmetric carotid artery. No reversible flow limiting stenosis. Multifocal bilateral advanced MCA branch narrowing, most proximally along a left M2 branch. Extensive atheromatous type narrowings of the bilateral A2 and distal branches. Narrowings are marked on thick mips. 3 mm superiorly directed A-comm region aneurysm. 2 mm infundibulum or aneurysm posteriorly from the supraclinoid right ICA. Posterior circulation: Left vertebral artery dominance. The right vertebral artery ends in PICA. Hypoplastic left P1 segment. Advanced atherosclerotic irregularity and multifocal narrowing of the bilateral PCAs, more advanced and proximal on the right at the P2 segment. Negative for aneurysm. Narrowings are marked on thick mips. Venous sinuses: Patent Anatomic variants: As described above Delayed phase: Not obtained in the emergent setting Text page with results were sent 03/19/2016 at 2:07 pm to Dr. Ritta Slot . Review of the MIP images confirms the above findings IMPRESSION: 1. No emergent large vessel occlusion. 2. Intracranial atherosclerosis with multifocal advanced narrowing of medium size vessels in all major vascular territories. 3. Atherosclerosis in the neck without flow limiting stenosis. Outpouching at the left ICA bulb is likely atheromatous ulceration. 4. 3 mm anterior communicating artery aneurysm. 2 mm infundibulum or aneurysm of the right supraclinoid ICA. 5. Atrophy and advanced chronic microvascular disease. 6. Emphysema. Electronically Signed   By: Marnee Spring M.D.   On: 03/19/2016 14:08   Ct Head Wo Contrast  Result Date: 03/19/2016 CLINICAL  DATA:  Left sided facial droop, nonverbal EXAM: CT HEAD WITHOUT CONTRAST TECHNIQUE: Contiguous axial images were obtained from the base of the skull through the vertex without intravenous contrast. COMPARISON:  None. FINDINGS: Brain: The ventricles are significantly enlarged, and there prominent cortical sulci, findings which are consistent with diffuse atrophy. The septum is midline in position. Moderate small vessel ischemic change is noted throughout the left ventricle white matter. There appear to be several small lacunar infarcts which are old on the right. No hemorrhage, mass lesion, or acute infarction is seen. Vascular: No vascular abnormality is noted on this unenhanced study. Skull: On bone window images, no calvarial abnormality is noted. Sinuses/Orbits: The paranasal sinuses are well pneumatized. Other: None. IMPRESSION: Diffuse atrophy and moderate small vessel ischemic change. No acute intracranial abnormality. Electronically Signed   By: Dwyane Dee M.D.   On: 03/19/2016 13:33   Ct Angio Neck W Or Wo Contrast  Result Date: 03/19/2016 CLINICAL DATA:  Left-sided rigidity.  Left-sided facial droop. EXAM: CT ANGIOGRAPHY HEAD AND NECK TECHNIQUE: Multidetector CT imaging of the head and neck was performed using the standard protocol during bolus administration of intravenous contrast.  Multiplanar CT image reconstructions and MIPs were obtained to evaluate the vascular anatomy. Carotid stenosis measurements (when applicable) are obtained utilizing NASCET criteria, using the distal internal carotid diameter as the denominator. CONTRAST:  50 cc Isovue 370 intravenous COMPARISON:  Head CT from earlier today FINDINGS: CTA NECK FINDINGS Aortic arch: 4 vessel branching with aberrant right subclavian artery. Atherosclerosis. No acute finding. Right carotid system: Atherosclerotic plaque mainly at the common carotid bifurcation. Proximal ICA tortuosity and kinking. No flow limiting stenosis or dissection.  Negative for ulceration. Left carotid system: Atherosclerotic plaque mainly at the common carotid bifurcation without flow limiting stenosis. Posterior outpouching at the proximal ICA bulb is likely atheromatous ulceration. Negative for dissection. Vertebral arteries: No flow limiting stenosis in the proximal subclavian arteries. Mild bilateral subclavian artery atherosclerotic narrowing proximal to the first rib crossings. Left dominant system. Both vessels are patent to the dura without signs of dissection. Skeleton: No acute finding. Diffuse cervical spine degeneration with reversed lordosis. Other neck: Negative for incidental mass lesion. Upper chest: Centrilobular and panlobular emphysema. Granulomatous type calcifications in the thoracic nodes. Review of the MIP images confirms the above findings CTA HEAD FINDINGS Anterior circulation: Symmetric carotid artery. No reversible flow limiting stenosis. Multifocal bilateral advanced MCA branch narrowing, most proximally along a left M2 branch. Extensive atheromatous type narrowings of the bilateral A2 and distal branches. Narrowings are marked on thick mips. 3 mm superiorly directed A-comm region aneurysm. 2 mm infundibulum or aneurysm posteriorly from the supraclinoid right ICA. Posterior circulation: Left vertebral artery dominance. The right vertebral artery ends in PICA. Hypoplastic left P1 segment. Advanced atherosclerotic irregularity and multifocal narrowing of the bilateral PCAs, more advanced and proximal on the right at the P2 segment. Negative for aneurysm. Narrowings are marked on thick mips. Venous sinuses: Patent Anatomic variants: As described above Delayed phase: Not obtained in the emergent setting Text page with results were sent 03/19/2016 at 2:07 pm to Dr. Ritta Slot . Review of the MIP images confirms the above findings IMPRESSION: 1. No emergent large vessel occlusion. 2. Intracranial atherosclerosis with multifocal advanced narrowing  of medium size vessels in all major vascular territories. 3. Atherosclerosis in the neck without flow limiting stenosis. Outpouching at the left ICA bulb is likely atheromatous ulceration. 4. 3 mm anterior communicating artery aneurysm. 2 mm infundibulum or aneurysm of the right supraclinoid ICA. 5. Atrophy and advanced chronic microvascular disease. 6. Emphysema. Electronically Signed   By: Marnee Spring M.D.   On: 03/19/2016 14:08     Garth Bigness, MD 03/19/2016, 5:06 PM PGY-1, [facility] Family Medicine FPTS Intern pager: [phone], text pages welcome  FPTS Upper-Level Resident Addendum  I have independently interviewed and examined the patient. I have discussed the above with the original author and agree with their documentation. My edits for correction/addition/clarification are in blue. Please see also any attending notes.   Willadean Carol, MD PGY-2, [facility] Family Medicine FPTS Service pager: [phone] (text pages welcome through AMION)

## 2016-03-20 ENCOUNTER — Inpatient Hospital Stay (HOSPITAL_COMMUNITY): Payer: Medicare HMO

## 2016-03-20 ENCOUNTER — Other Ambulatory Visit (HOSPITAL_COMMUNITY): Payer: Medicare HMO

## 2016-03-20 DIAGNOSIS — F0280 Dementia in other diseases classified elsewhere without behavioral disturbance: Secondary | ICD-10-CM

## 2016-03-20 DIAGNOSIS — E43 Unspecified severe protein-calorie malnutrition: Secondary | ICD-10-CM

## 2016-03-20 DIAGNOSIS — R059 Cough, unspecified: Secondary | ICD-10-CM

## 2016-03-20 DIAGNOSIS — E785 Hyperlipidemia, unspecified: Secondary | ICD-10-CM

## 2016-03-20 DIAGNOSIS — R05 Cough: Secondary | ICD-10-CM

## 2016-03-20 DIAGNOSIS — G300 Alzheimer's disease with early onset: Secondary | ICD-10-CM

## 2016-03-20 LAB — MAGNESIUM: MAGNESIUM: 1.9 mg/dL (ref 1.7–2.4)

## 2016-03-20 LAB — LIPID PANEL
CHOL/HDL RATIO: 2.9 ratio
CHOLESTEROL: 174 mg/dL (ref 0–200)
HDL: 60 mg/dL (ref >40)
LDL Cholesterol: 103 mg/dL — ABNORMAL HIGH (ref 0–99)
Triglycerides: 55 mg/dL (ref <150)
VLDL: 11 mg/dL (ref 0–40)

## 2016-03-20 LAB — BASIC METABOLIC PANEL
Anion gap: 10 (ref 5–15)
BUN: 10 mg/dL (ref 6–20)
CO2: 21 mmol/L — ABNORMAL LOW (ref 22–32)
Calcium: 9.3 mg/dL (ref 8.9–10.3)
Chloride: 106 mmol/L (ref 101–111)
Creatinine, Ser: 0.52 mg/dL (ref 0.44–1.00)
GFR calc Af Amer: >60 mL/min (ref >60)
Glucose, Bld: 120 mg/dL — ABNORMAL HIGH (ref 65–99)
POTASSIUM: 4 mmol/L (ref 3.5–5.1)
SODIUM: 137 mmol/L (ref 135–145)

## 2016-03-20 LAB — CBC
HCT: 34.9 % — ABNORMAL LOW (ref 36.0–46.0)
Hemoglobin: 12.1 g/dL (ref 12.0–15.0)
MCH: 30.9 pg (ref 26.0–34.0)
MCHC: 34.7 g/dL (ref 30.0–36.0)
MCV: 89 fL (ref 78.0–100.0)
Platelets: 164 10*3/uL (ref 150–400)
RBC: 3.92 MIL/uL (ref 3.87–5.11)
RDW: 13.7 % (ref 11.5–15.5)
WBC: 7.8 10*3/uL (ref 4.0–10.5)

## 2016-03-20 LAB — TSH: TSH: 2.141 u[IU]/mL (ref 0.350–4.500)

## 2016-03-20 MED ORDER — RESOURCE THICKENUP CLEAR PO POWD
ORAL | Status: DC | PRN
  Filled 2016-03-20: qty 125

## 2016-03-20 MED ORDER — DEXTROSE-NACL 5-0.45 % IV SOLN
INTRAVENOUS | Status: DC
  Administered 2016-03-20: 16:00:00 via INTRAVENOUS

## 2016-03-20 MED ORDER — ORAL CARE MOUTH RINSE
15.0000 mL | Freq: Two times a day (BID) | OROMUCOSAL | Status: DC
  Administered 2016-03-20 – 2016-03-23 (×6): 15 mL via OROMUCOSAL

## 2016-03-20 NOTE — Progress Notes (Signed)
Modified Barium Swallow Progress Note  Patient Details  Name: Debbie Cisneros MRN: [id number] Date of Birth: 28-Jan-1930  Today's Date: 03/20/2016  Modified Barium Swallow completed.  Full report located under Chart Review in the Imaging Section.  Brief recommendations include the following:  Clinical Impression  Ms. Debbie Cisneros exhibits a moderate oral and moderate sensorimotor (sensory > motor) pharyngeal dysphagia. Observed oral phase impairments including sublingual spillage, holding, delayed transit, weak manipulation, and lingual residue.  Noted significant delayed swallow initiation to the valleculae and pyriform sinuses throughout study due to decreased sensation. Aspiration after the swallow from pyriform sinus residue with thin liquids via cup resulting in a reflexive cough from pt that ultimately did not clear aspirates. Suspect aspiration after the swallow with nectar thick liquids due to new aspirates observed when fluoro turned back on. Compensatory strategy using a dry spoon in attempts to initiate bolus propulsion was inconsistently effective. Recommend Dys 1 (pureed) solids, honey thick liquids, meds crushed. Educated granddaughter that modified diet/liquids will help mitigate impairments however aspiration risk continues to be high given severity of dementia. ST will f/u for treatment for diet tolerance and continued education.   Swallow Evaluation Recommendations       SLP Diet Recommendations: Honey thick liquids;Dysphagia 1 (Puree) solids   Liquid Administration via: Cup;No straw   Medication Administration: Crushed with puree   Supervision: Full supervision/cueing for compensatory strategies;Full assist for feeding   Compensations: Slow rate;Small sips/bites;Lingual sweep for clearance of pocketing   Postural Changes: Seated upright at 90 degrees   Oral Care Recommendations: Oral care BID   Other Recommendations: Order thickener from pharmacy    Royce MacadamiaLitaker, Kassadi Presswood  Willis 03/20/2016,2:50 PM   Breck CoonsLisa Willis Lonell FaceLitaker M.Ed [employer]SLP Pager [phone]

## 2016-03-20 NOTE — Progress Notes (Signed)
Pt left unit for MRI.

## 2016-03-20 NOTE — Progress Notes (Signed)
OT Cancellation Note and Discharge  Patient Details Name: Debbie Cisneros MRN: [id number] DOB: 12/29/1930   Cancelled Treatment:    Reason Eval/Treat Not Completed: Other (comment). Received message from PT: Discussed pt case with MD who states that pt is not appropriate for acute therapy services at this time. If needs change, please reconsult.     Evette GeorgesLeonard, Sparkle Aube Eva [phone] 03/20/2016, 9:40 AM

## 2016-03-20 NOTE — Progress Notes (Signed)
Family Medicine Teaching Service Daily Progress Note Intern Pager: [phone]  Patient name: Debbie Cisneros Medical record number: [id number] Date of birth: [birth year] Age: 83 y.o. Gender: female  Primary Care Provider: Pcp Not In System Consultants: neurology, palliative Code Status: DNR  Pt Overview and Major Events to Date:  3/1 admitted for possible stroke  Assessment and Plan: Debbie Cisneros is a 83 y.o. female presenting with L facial droop and L arm flaccidity via EMS. PMH is significant for Alzheimer's dementia, hx tobacco and alcohol abuse (quit 10 years ago).  Left facial droop and left arm weakness: Daughter denies hx of risk factors other than long term tobacco use (no HTN, CHF, afib diagnoses). Patient with acute onset of facial droop and L arm weakness this morning. Code Stroke called, patient found to be out of TPA window. CT head without signs of bleeding. CTA head with diffuse intracranial atherosclerosis. MRI ordered.  - neurology following, appreciate recs - aspirin 325mg  - PT/OT/SLP eval and treat (patient failed bedside swallow) - permissive hypertension x 24 hours, metoprolol 5mg  q4h PRN for SBP >220, DBP >120 - continued goals of care discussions, palliative care consulted. Changed to DNR today.  Hypokalemia: K 3.3 on admission. - KCl added to IVFs - Check magnesium in the morning  Abnormal UA: UA in the ED with large leukocytes and positive nitrites - Pt does not meet McGreer's criteria for UTI - Will not start antibiotics   Alzheimer's dementia: Diagnosed at age 67, per daughter. Patient bed bound at home.  - Previously on Aricept years ago, no longer taking this. - patient wears dentures, will have daughter bring these for orientation - counseled family on being with patient as much as possible to avoid delirium   Protein-calorie malnutrition: Patient frail and cachetic-appearing on exam with diffuse muscle atrophy. - Can consider nutrition consult  when patient is taking PO  FEN/GI: NPO pending bedside swallow test. MIVF with D51/2NS w KCl at 80cc/hr Prophylaxis: Heparin sq  Disposition: continued inpatient management   Subjective:  Debbie Cisneros is smiling and interacting this morning. Patient's family is around her, had long discussion of goals of care. They are very loving and want to respect her wishes not to be kept alive by a machine.  Objective: Temp:  [95.4 F (35.2 C)-98.7 F (37.1 C)] 98.1 F (36.7 C) (03/02 0845) Pulse Rate:  [69-100] 79 (03/02 0845) Resp:  [16-19] 16 (03/02 0845) BP: (158-212)/(78-118) 158/109 (03/02 0845) SpO2:  [94 %-100 %] 98 % (03/02 0845) Weight:  [97 lb (44 kg)] 97 lb (44 kg) (03/01 1421) Physical Exam: General: Cachectic female lying bed in NAD. Tracks with eyes. Able to say yes and no. Eyes: EOMI ENTM: somewhat dry mucous membranes Neck: supple, no JVD Cardiovascular: RRR, no murmur Respiratory: CTAB Gastrointestinal: SNTND, +BS, no masses or rebound MSK: thin extremities, minimal muscle mass. Moves RUE spontaneously, LUE contracted in flexion. Bilateral LEs contracted.  Derm: no rashes or wounds visualized Neuro: patient unable to cooperate with commands for neuro exam. EOMI, responds yes to light touch. Unable to extend LUE actively.  Psych: pleasant.   Laboratory:  Recent Labs Lab 03/19/16 1318 03/19/16 1324 03/20/16 0730  WBC 7.2  --  7.8  HGB 12.5 13.6 12.1  HCT 36.9 40.0 34.9*  PLT 150  --  164    Recent Labs Lab 03/19/16 1318 03/19/16 1324 03/20/16 0730  NA 138 141 137  K 3.3* 3.3* 4.0  CL 107 104 106  CO2 22  --  21*  BUN 15 18 10   CREATININE 0.65 0.60 0.52  CALCIUM 9.9  --  9.3  PROT 6.7  --   --   BILITOT QUANTITY NOT SUFFICIENT, UNABLE TO PERFORM TEST  --   --   ALKPHOS 69  --   --   ALT QUANTITY NOT SUFFICIENT, UNABLE TO PERFORM TEST  --   --   AST QUANTITY NOT SUFFICIENT, UNABLE TO PERFORM TEST  --   --   GLUCOSE 94 96 120*     Imaging/Diagnostic Tests: MRI pending.  Garth BignessKathryn Asja Frommer, MD 03/20/2016, 9:53 AM PGY-1, [facility] FPTS Intern pager: [phone], text pages welcome

## 2016-03-20 NOTE — Evaluation (Signed)
Clinical/Bedside Swallow Evaluation Patient Details  Name: Montey Horarma Mcadoo MRN: [id number] Date of Birth: 1931/01/02  Today's Date: 03/20/2016 Time: SLP Start Time (ACUTE ONLY): [id number] SLP Stop Time (ACUTE ONLY): 0843 SLP Time Calculation (min) (ACUTE ONLY): 15 min  Past Medical History: No past medical history on file. Past Surgical History: No past surgical history on file. HPI:  Pt is an 83 y.o.femalewith PMH significant for Alzheimer's dementia, hx tobacco and alcohol abuse (quit 10 years ago). Presented with L facial droop and L arm flaccidity. CT of head showed diffuse atrophy and moderate small vessel ischemic change. No acute intracranial abnormality. MRI has been ordered.   Assessment / Plan / Recommendation Clinical Impression  Evidence of pharyngeal dysphagia in pt with significant dementia without prior history of swallow difficulties per family. Daughter stated she coughed yesterday with liquids prior to EMS arriving. Daughter and granddaughter deny history of s/s aspiration. MBS recommended given acute changes in swallow, motor function without dysphagia history. MBS scheduled today at 1:00. Continue NPO   SLP Visit Diagnosis: Dysphagia, unspecified (R13.10)    Aspiration Risk  Moderate aspiration risk    Diet Recommendation NPO        Other  Recommendations     Follow up Recommendations        Frequency and Duration            Prognosis        Swallow Study   General HPI: Pt is an 83 y.o.femalewith PMH significant for Alzheimer's dementia, hx tobacco and alcohol abuse (quit 10 years ago). Presented with L facial droop and L arm flaccidity. CT of head showed diffuse atrophy and moderate small vessel ischemic change. No acute intracranial abnormality. MRI has been ordered. Type of Study: Bedside Swallow Evaluation Previous Swallow Assessment:  (none) Diet Prior to this Study: NPO Temperature Spikes Noted: No Respiratory Status: Room air History of Recent  Intubation: No Behavior/Cognition: Alert;Requires cueing;Confused;Cooperative Oral Cavity Assessment:  (would not open oral cavity to assess) Oral Care Completed by SLP: No Oral Cavity - Dentition: Dentures, not available Vision: Functional for self-feeding Self-Feeding Abilities: Total assist Patient Positioning: Upright in bed Baseline Vocal Quality:  (no vocalizations) Volitional Cough: Cognitively unable to elicit Volitional Swallow: Unable to elicit    Oral/Motor/Sensory Function Overall Oral Motor/Sensory Function:  (mod-severe left facial)   Ice Chips Ice chips: Not tested   Thin Liquid Thin Liquid: Impaired Presentation: Cup;Straw Oral Phase Impairments: Reduced labial seal;Reduced lingual movement/coordination;Poor awareness of bolus Oral Phase Functional Implications: Right anterior spillage;Left anterior spillage Pharyngeal  Phase Impairments: Suspected delayed Swallow;Cough - Delayed    Nectar Thick Nectar Thick Liquid: Not tested   Honey Thick Honey Thick Liquid: Not tested   Puree Puree: Not tested   Solid   GO   Solid: Not tested        Royce MacadamiaLitaker, Tiffancy Moger Willis 03/20/2016,9:17 AM  Breck CoonsLisa Willis Lonell FaceLitaker M.Ed [employer]-SLP Pager [phone]

## 2016-03-20 NOTE — Progress Notes (Signed)
PT Cancellation Note  Patient Details Name: Debbie Cisneros MRN: [id number] DOB: 01/26/1930   Cancelled Treatment:    Reason Eval/Treat Not Completed: PT screened, no needs identified, will sign off.   Discussed pt case with MD who states that pt is not appropriate for acute therapy services at this time. If needs change, please reconsult.    Marylynn PearsonLaura D Javia Dillow 03/20/2016, 9:40 AM   Conni SlipperLaura Zaccheus Edmister, PT, DPT Acute Rehabilitation Services Pager: [phone]

## 2016-03-20 NOTE — Progress Notes (Signed)
STROKE TEAM PROGRESS NOTE   HISTORY OF PRESENT ILLNESS (per record) Brie Floren is an 83 y.o. female who has dementia at baseline. Lives with family, is immobile and cannot take care of her ADL's.  She was seen at her baseline this AM 03/19/2016 at 0[facility]) but at 0730 she was noted to have left arm facial droop, and left arm flaccidity. EMS was called about 1230 due to patient not being herself and she was brought to [facility] has a code stroke. CT head showed no acute bleed or stroke. CTA head showed no large vessel occlusion. Family was not present for some time. No number was in chart to get hold of family. Modified Rankin: Rankin Score=4. atient was not administered IV t-PA secondary to being out of the window. She was admitted for further evaluation and treatment.   SUBJECTIVE (INTERVAL HISTORY) Daughter and granddaughter are at bedside. Pt in sleep but able to arouse. Nonverbal and not following commands. Still has left hemiplegia. MRI showed right CR small infarct.    OBJECTIVE Temp:  [96 F (35.6 C)-98.7 F (37.1 C)] 98.1 F (36.7 C) (03/02 0845) Pulse Rate:  [69-100] 79 (03/02 0845) Cardiac Rhythm: Heart block (03/02 0846) Resp:  [16-19] 16 (03/02 0845) BP: (158-206)/(78-118) 158/109 (03/02 0845) SpO2:  [94 %-100 %] 98 % (03/02 0845)  CBC:  Recent Labs Lab 03/19/16 1318 03/19/16 1324 03/20/16 0730  WBC 7.2  --  7.8  NEUTROABS 3.9  --   --   HGB 12.5 13.6 12.1  HCT 36.9 40.0 34.9*  MCV 89.1  --  89.0  PLT 150  --  164    Basic Metabolic Panel:  Recent Labs Lab 03/19/16 1318 03/19/16 1324 03/20/16 0730  NA 138 141 137  K 3.3* 3.3* 4.0  CL 107 104 106  CO2 22  --  21*  GLUCOSE 94 96 120*  BUN 15 18 10   CREATININE 0.65 0.60 0.52  CALCIUM 9.9  --  9.3  MG  --   --  1.9    Lipid Panel:    Component Value Date/Time   CHOL 174 03/20/2016 0607   TRIG 55 03/20/2016 0607   HDL 60 03/20/2016 0607   CHOLHDL 2.9 03/20/2016 0607   VLDL 11 03/20/2016 0607   LDLCALC  103 (H) 03/20/2016 0607   HgbA1c: No results found for: HGBA1C Urine Drug Screen: No results found for: LABOPIA, COCAINSCRNUR, LABBENZ, AMPHETMU, THCU, LABBARB    IMAGING I have personally reviewed the radiological images below and agree with the radiology interpretations.  Ct Head Wo Contrast 03/19/2016 Diffuse atrophy and moderate small vessel ischemic change. No acute intracranial abnormality.   Ct Angio Head W Or Wo Contrast Ct Angio Neck W Or Wo Contrast 03/19/2016 1. No emergent large vessel occlusion. 2. Intracranial atherosclerosis with multifocal advanced narrowing of medium size vessels in all major vascular territories. 3. Atherosclerosis in the neck without flow limiting stenosis. Outpouching at the left ICA bulb is likely atheromatous ulceration. 4. 3 mm anterior communicating artery aneurysm. 2 mm infundibulum or aneurysm of the right supraclinoid ICA. 5. Atrophy and advanced chronic microvascular disease. 6. Emphysema.   EEG 1) generalized irregular slow activity  2) slow PDR  MRI - right CR small acute infarct  2D echo - cancelled by primary team   PHYSICAL EXAM  Temp:  [98.1 F (36.7 C)-98.7 F (37.1 C)] 98.1 F (36.7 C) (03/02 0845) Pulse Rate:  [76-100] 79 (03/02 0845) Resp:  [16] 16 (03/02 0845)  BP: (158-206)/(78-109) 158/109 (03/02 0845) SpO2:  [98 %-100 %] 98 % (03/02 0845)  General - cachactic, well developed, sleepy but arousable.  Ophthalmologic - Fundi not visualized due to noncooperation.  Cardiovascular - Regular rate and rhythm.  Neuro - patient is sleep but easily arousable. Opens eyes on voice, however, not following commands. Nonverbal. No gaze deviation. Attending to both sides. Left eye blind with cataract, right eye able to blink to visual threat bilaterally. Severe left facial droop, tongue midline. Left UE and LE hemiplegia, 0/5 on pain stimulation. RUE and RLE spontaneous movement, RUE at least 3+/5 and RLE at least 3-/5. DTR 1+, but  bilateral positive. Sensation, coordination and gait not tested.   ASSESSMENT/PLAN Ms. Montey Horarma Prime is a 83 y.o. female with history of Alzheimer's dementia and tobacco abuse presenting with left facial weakness and left arm flaccid. She did not receive IV t-PA due to being out of the window.   Stroke:  right small CR infarct, secondary to small vessel disease source  CTA head and neck no LVO. Intracranial atherosclerosis in all major vessels, no flow limiting stenosis. L ICA ulceration. 3mm ACom aneurysm. Emphysema.  MRI  right small CR infarct  EEG no seizure  2-D echo canceled by primary team for palliative care  LDL 103  HgbA1c pending  Heparin 5000 units sq tid for VTE prophylaxis  Diet NPO time specified  No antithrombotic prior to admission, now on aspirin 325 mg daily. Continue aspirin on discharge  Palliative care consulted  Therapy recommendations:  pending   Disposition:  pending  (lives w/ dtr, bedbound) - agree with palliative care and home hospice  Hyperlipidemia  Home meds:  No statin  LDL 103, goal < 70  No need standing due to palliative care consideration   Tobacco abuse  Current smoker  Smoking cessation counseling provided  Alzheimer's dementia  Advanced  Bedbound   Agree with palliative care and home hospice  Other Stroke Risk Factors  Advanced age  Other Active Problems   Hypokalemia  Abnormal UA  Protein calorie malnutrition - severe  Hospital day # 1  Neurology will sign off. Please call with questions. No neurology follow-up needed at this time. Thanks for the consult.  Marvel PlanJindong Jamye Balicki, MD PhD Stroke Neurology 03/20/2016 6:53 PM   To contact Stroke Continuity provider, please refer to [email or web address]. After hours, contact General Neurology

## 2016-03-21 DIAGNOSIS — Z515 Encounter for palliative care: Secondary | ICD-10-CM

## 2016-03-21 LAB — HEMOGLOBIN A1C
HEMOGLOBIN A1C: 5.3 % (ref 4.8–5.6)
Mean Plasma Glucose: 105 mg/dL

## 2016-03-21 MED ORDER — AMLODIPINE BESYLATE 2.5 MG PO TABS
2.5000 mg | ORAL_TABLET | Freq: Every day | ORAL | Status: DC
  Administered 2016-03-21: 2.5 mg via ORAL
  Filled 2016-03-21: qty 1

## 2016-03-21 MED ORDER — ASPIRIN EC 325 MG PO TBEC: 325.0000 mg | DELAYED_RELEASE_TABLET | Freq: Every day | ORAL | Status: DC

## 2016-03-21 NOTE — Consult Note (Signed)
Consultation Note Date: 03/21/2016   Patient Name: Debbie Cisneros  DOB: [birth year]  MRN: [id number]  Age / Sex: 83 y.o., female  PCP: Pcp Not In System Referring Physician: Moses Manners, MD  Reason for Consultation: Establishing goals of care, Hospice Evaluation and Psychosocial/spiritual support  HPI/Patient Profile: 83 y.o. female  with past medical history of Early onset dementia admitted on 03/19/2016 with left-sided facial droop and left arm flaccidity. Per MRI was confirmed that patient did have an acute infarction on the right. Patient failed her bedside swallow. She also was found to have a urinary tract infection..   Clinical Assessment and Goals of Care: Patient is bedbound at baseline and total care. She lives with her daughter and granddaughter and they as well as other family members provide 24 7 care for her at baseline. Her appetite has been good and prior to stroke no sod outward signs and symptoms of aspiration. Her weight is 97 pounds however her albumin level is 3.9. Since her stroke and admission to the hospital on 03/19/2016 she shown little interest in food and has been sleeping most of the time. On 03/21/2016 she has become a little more alert in the afternoon when all of her family members are around her  No designated healthcare proxy. They make decisions as a family. Her daughter Adelene Amas and granddaughter Nicki [place] or her primary caretakers    SUMMARY OF RECOMMENDATIONS   DO NOT RESUSCITATE DO NOT INTUBATE Home with hospice No PEG tube  Code Status/Advance Care Planning:  DNR   Palliative Prophylaxis:   Aspiration, Bowel Regimen, Delirium Protocol, Frequent Pain Assessment, Oral Care and Turn Reposition  Additional Recommendations (Limitations, Scope, Preferences):  Avoid Hospitalization, Minimize Medications, Initiate Comfort Feeding, No Artificial Feeding, No Chemotherapy,  No Glucose Monitoring, No Hemodialysis, No Radiation, No Surgical Procedures and No Tracheostomy  Psycho-social/Spiritual:   Desire for further Chaplaincy support:no  Additional Recommendations: Grief/Bereavement Support  Prognosis:   < 6 months in the setting of advanced dementia (patient is bedbound at baseline nonverbal at baseline) now with a new CVA and impaired oral intake. High risk for aspiration pneumonia  Discharge Planning: Home with Hospice      Primary Diagnoses: Present on Admission: **None**   I have reviewed the medical record, interviewed the patient and family, and examined the patient. The following aspects are pertinent.  No past medical history on file. Social History   Social History  . Marital status: Widowed    Spouse name: N/A  . Number of children: N/A  . Years of education: N/A   Social History Main Topics  . Smoking status: Not on file  . Smokeless tobacco: Not on file  . Alcohol use Not on file  . Drug use: Unknown  . Sexual activity: Not on file   Other Topics Concern  . Not on file   Social History Narrative  . No narrative on file   No family history on file. Scheduled Meds: . mouth rinse  15 mL Mouth Rinse BID  Continuous Infusions: PRN Meds:.acetaminophen, RESOURCE THICKENUP CLEAR Medications Prior to Admission:  Prior to Admission medications   Medication Sig Start Date End Date Taking? Authorizing Provider  acetaminophen (TYLENOL) 325 MG tablet Take 650 mg by mouth every 6 (six) hours as needed for mild pain.   Yes Historical Provider, MD  Multiple Vitamin (MULTIVITAMIN) tablet Take 1 tablet by mouth daily.   Yes Historical Provider, MD  vitamin E 100 UNIT capsule Take 100 Units by mouth daily.   Yes Historical Provider, MD   Not on File Review of Systems  Unable to perform ROS: Patient nonverbal    Physical Exam  Constitutional:  Cachetic frail, acutely ill appearing female  HENT:  Head: Normocephalic and  atraumatic.  Neck: Normal range of motion.  Cardiovascular: Normal rate.   Pulmonary/Chest: Effort normal.  Musculoskeletal:  Flaccid left upper extremity Can spontaneously move right upper extremity and right lower extremity but not consistently to command  Neurological:  Alternating between somnolence and alertness. She is nonverbal. At baseline she sometimes odors gibberish but is mostly nonverbal at baseline as well Left sided facial droop Patient is bedbound at baseline and total care  Skin: Skin is warm and dry.  Psychiatric:  No agitation  Nursing note and vitals reviewed.   Vital Signs: BP (!) 198/83 (BP Location: Right Arm)   Pulse (!) 58   Temp 97.9 F (36.6 C) (Axillary)   Resp 16   Ht 5\' 3"  (1.6 m)   Wt 44 kg (97 lb)   SpO2 100%   BMI 17.18 kg/m  Pain Assessment: PAINAD       SpO2: SpO2: 100 % O2 Device:SpO2: 100 % O2 Flow Rate: .   IO: Intake/output summary:  Intake/Output Summary (Last 24 hours) at 03/21/16 1406 Last data filed at 03/21/16 0310  Gross per 24 hour  Intake          1045.33 ml  Output                0 ml  Net          1045.33 ml    LBM: Last BM Date: 03/17/16 Baseline Weight: Weight: 44 kg (97 lb) Most recent weight: Weight: 44 kg (97 lb)     Palliative Assessment/Data:   Flowsheet Rows   Flowsheet Row Most Recent Value  Intake Tab  Referral Department  Hospitalist  Unit at Time of Referral  Med/Surg Unit  Palliative Care Primary Diagnosis  Neurology  Date Notified  03/20/16  Palliative Care Type  New Palliative care  Reason for referral  Clarify Goals of Care  Date of Admission  03/19/16  Date first seen by Palliative Care  03/21/16  # of days Palliative referral response time  1 Day(s)  # of days IP prior to Palliative referral  1  Clinical Assessment  Palliative Performance Scale Score  20%  Pain Max last 24 hours  Not able to report  Pain Min Last 24 hours  Not able to report  Dyspnea Max Last 24 Hours  Not able to  report  Dyspnea Min Last 24 hours  Not able to report  Nausea Max Last 24 Hours  Not able to report  Nausea Min Last 24 Hours  Not able to report  Anxiety Max Last 24 Hours  Not able to report  Anxiety Min Last 24 Hours  Not able to report  Other Max Last 24 Hours  Not able to report  Psychosocial & Spiritual Assessment  Palliative  Care Outcomes  Patient/Family meeting held?  Yes  Who was at the meeting?  dtr, granddaughter, son, niece  Palliative Care Outcomes  Clarified goals of care, Provided psychosocial or spiritual support, Counseled regarding hospice  Patient/Family wishes: Interventions discontinued/not started   Mechanical Ventilation, BiPAP, Hemodialysis, Vasopressors, Trach, NIPPV, Tube feedings/TPN, PEG  Palliative Care follow-up planned  No      Time In: 1200 Time Out: 1310 Time Total: 70 min Greater than 50%  of this time was spent counseling and coordinating care related to the above assessment and plan.  Signed by: Irean HongSarah Grace Inaki Vantine, NP   Please contact Palliative Medicine Team phone at [phone] for questions and concerns.  For individual provider: See Loretha StaplerAmion

## 2016-03-21 NOTE — Progress Notes (Signed)
Family Medicine Teaching Service Daily Progress Note Intern Pager: [phone]  Patient name: Debbie Cisneros Medical record number: [id number] Date of birth: 1930-02-01 Age: 83 y.o. Gender: female  Primary Care Provider: Pcp Not In System Consultants: neurology, palliative Code Status: DNR  Pt Overview and Major Events to Date:  3/1 admitted for possible stroke 3/2 MRI confirms stroke, SLP rec'd dys 1 with nectar thick liquids  Assessment and Plan: Debbie Cisneros is a 83 y.o. female presenting with L facial droop and L arm flaccidity via EMS. PMH is significant for Alzheimer's dementia, hx tobacco and alcohol abuse (quit 10 years ago).  Right lentiform nucleus stroke, acute: Confirmed via MRI.  - SLP recommended dysphagia 1 with nectar thick. Patient taking spoon feedings by family member this morning without cough.  - out of permissive HTN window, started norvasc 2.5mg . No desire for tight BP control given patient's underlying illness.  - continued goals of care discussions, palliative care consulted. Changed to DNR 3/2. Anticipate comfort care, if family decides on this, will change back to regular diet. Patient would likely benefit from home hospice.   Alzheimer's dementia: Diagnosed at age 59, per daughter. Patient bed bound at home.  - patient wears dentures, will have daughter bring these for orientation - counseled family on being with patient as much as possible to avoid delirium   Protein-calorie malnutrition: Patient frail and cachetic-appearing on exam with diffuse muscle atrophy. - nutrition consult  FEN/GI: dys1. MIVF with D51/2NS w KCl at 50cc/hr Prophylaxis: Heparin sq  Disposition:SNF vs home with home hospice  Subjective:  Debbie Cisneros is lying in bed this morning. She repeats "good morning" back to me. Niece in the room, feeding patient applesauce. Long discussion of goals of care. I recommended further family discussion but given family's expression of Mrs.  Cisneros' wishes, I suggested that comfort care and allowing the patient to eat anything she would like would be more than reasonable. Niece will discuss with the rest of the family and palliative care.   Objective: Temp:  [97.9 F (36.6 C)-98.5 F (36.9 C)] 97.9 F (36.6 C) (03/03 0607) Pulse Rate:  [58-79] 58 (03/03 0607) Resp:  [16-18] 16 (03/03 0607) BP: (151-211)/(69-109) 198/83 (03/03 0614) SpO2:  [98 %-100 %] 100 % (03/03 1478) Physical Exam: General: Cachectic female lying bed in NAD. Tracks with eyes. Able to say yes and no. Eyes: EOMI ENTM: somewhat dry mucous membranes Neck: supple, no JVD Cardiovascular: RRR, no murmur Respiratory: CTAB Gastrointestinal: SNTND, +BS, no masses or rebound MSK: thin extremities, minimal muscle mass. Moves RUE spontaneously, LUE contracted in flexion. Bilateral LEs contracted.  Derm: no rashes or wounds visualized Neuro: patient unable to cooperate with commands for neuro exam. EOMI, responds yes to light touch. Unable to extend LUE actively.  Psych: pleasant.   Laboratory:  Recent Labs Lab 03/19/16 1318 03/19/16 1324 03/20/16 0730  WBC 7.2  --  7.8  HGB 12.5 13.6 12.1  HCT 36.9 40.0 34.9*  PLT 150  --  164    Recent Labs Lab 03/19/16 1318 03/19/16 1324 03/20/16 0730  NA 138 141 137  K 3.3* 3.3* 4.0  CL 107 104 106  CO2 22  --  21*  BUN 15 18 10   CREATININE 0.65 0.60 0.52  CALCIUM 9.9  --  9.3  PROT 6.7  --   --   BILITOT QUANTITY NOT SUFFICIENT, UNABLE TO PERFORM TEST  --   --   ALKPHOS 69  --   --  ALT QUANTITY NOT SUFFICIENT, UNABLE TO PERFORM TEST  --   --   AST QUANTITY NOT SUFFICIENT, UNABLE TO PERFORM TEST  --   --   GLUCOSE 94 96 120*    Imaging/Diagnostic Tests: Dg Swallowing Func-speech Pathology  Result Date: 03/20/2016 Objective Swallowing Evaluation: Type of Study: MBS-Modified Barium Swallow Study Patient Details Name: Debbie Cisneros MRN: [id number] Date of Birth: 01-Nov-1930 Today's Date: 03/20/2016 Time: SLP  Start Time (ACUTE ONLY): 1335-SLP Stop Time (ACUTE ONLY): 1359 SLP Time Calculation (min) (ACUTE ONLY): 24 min Past Medical History: No past medical history on file. Past Surgical History: No past surgical history on file. HPI: Pt is an 83 y.o.femalewith PMH significant for Alzheimer's dementia, hx tobacco and alcohol abuse (quit 10 years ago). Presented with L facial droop and L arm flaccidity. CT of head showed diffuse atrophy and moderate small vessel ischemic change. No acute intracranial abnormality. MRI has been ordered. Bedside swallow eval 03/20/16 with concerns for aspiration. MBS ordered. No Data Recorded Assessment / Plan / Recommendation CHL IP CLINICAL IMPRESSIONS 03/20/2016 Clinical Impression Debbie Cisneros exhibits a moderate oral and moderate sensorimotor (sensory > motor) pharyngeal dysphagia. Observed oral phase impairments including sublingual spillage, holding, delayed transit, weak manipulation, and lingual residue.  Noted significant delayed swallow initiation to the valleculae and pyriform sinuses throughout study due to decreased sensation. Aspiration after the swallow from pyriform sinus residue with thin liquids via cup resulting in a reflexive cough from pt that ultimately did not clear aspirates. Suspect aspiration after the swallow with nectar thick liquids due to new aspirates observed when fluoro turned back on. Compensatory strategy using a dry spoon in attempts to initiate bolus propulsion was inconsistently effective. Recommend Dys 1 (pureed) solids, honey thick liquids, meds crushed. Educated granddaughter that modified diet/liquids will help mitigate impairments however aspiration risk continues to be high given severity of dementia. ST will f/u for treatment for diet tolerance and continued education. SLP Visit Diagnosis Dysphagia, oral phase (R13.11);Dysphagia, pharyngeal phase (R13.13) Attention and concentration deficit following -- Frontal lobe and executive function deficit  following -- Impact on safety and function Moderate aspiration risk;Severe aspiration risk   CHL IP TREATMENT RECOMMENDATION 03/20/2016 Treatment Recommendations Therapy as outlined in treatment plan below   Prognosis 03/20/2016 Prognosis for Safe Diet Advancement Fair Barriers to Reach Goals Cognitive deficits Barriers/Prognosis Comment -- CHL IP DIET RECOMMENDATION 03/20/2016 SLP Diet Recommendations Honey thick liquids;Dysphagia 1 (Puree) solids Liquid Administration via Cup;No straw Medication Administration Crushed with puree Compensations Slow rate;Small sips/bites;Lingual sweep for clearance of pocketing Postural Changes Seated upright at 90 degrees   CHL IP OTHER RECOMMENDATIONS 03/20/2016 Recommended Consults -- Oral Care Recommendations Oral care BID Other Recommendations Order thickener from pharmacy   CHL IP FOLLOW UP RECOMMENDATIONS 03/20/2016 Follow up Recommendations Skilled Nursing facility   [facility] IP FREQUENCY AND DURATION 03/20/2016 Speech Therapy Frequency (ACUTE ONLY) min 2x/week Treatment Duration 2 weeks      CHL IP ORAL PHASE 03/20/2016 Oral Phase Impaired Oral - Pudding Teaspoon -- Oral - Pudding Cup -- Oral - Honey Teaspoon -- Oral - Honey Cup Delayed oral transit;Lingual/palatal residue;Weak lingual manipulation;Other (Comment) Oral - Nectar Teaspoon -- Oral - Nectar Cup Delayed oral transit;Lingual/palatal residue;Weak lingual manipulation;Other (Comment) Oral - Nectar Straw -- Oral - Thin Teaspoon -- Oral - Thin Cup Delayed oral transit;Lingual/palatal residue;Weak lingual manipulation;Other (Comment) Oral - Thin Straw -- Oral - Puree Delayed oral transit;Lingual/palatal residue;Weak lingual manipulation;Other (Comment);Holding of bolus Oral - Mech Soft -- Oral - Regular -- Oral -  Multi-Consistency -- Oral - Pill -- Oral Phase - Comment --  CHL IP PHARYNGEAL PHASE 03/20/2016 Pharyngeal Phase Impaired Pharyngeal- Pudding Teaspoon -- Pharyngeal -- Pharyngeal- Pudding Cup -- Pharyngeal -- Pharyngeal- Honey  Teaspoon -- Pharyngeal -- Pharyngeal- Honey Cup Delayed swallow initiation-pyriform sinuses;Delayed swallow initiation-vallecula;Reduced laryngeal elevation Pharyngeal -- Pharyngeal- Nectar Teaspoon -- Pharyngeal -- Pharyngeal- Nectar Cup Delayed swallow initiation-pyriform sinuses;Delayed swallow initiation-vallecula;Reduced laryngeal elevation;Penetration/Apiration after swallow Pharyngeal Material enters airway, passes BELOW cords without attempt by patient to eject out (silent aspiration) Pharyngeal- Nectar Straw -- Pharyngeal -- Pharyngeal- Thin Teaspoon -- Pharyngeal -- Pharyngeal- Thin Cup Delayed swallow initiation-pyriform sinuses;Delayed swallow initiation-vallecula;Penetration/Apiration after swallow;Reduced laryngeal elevation Pharyngeal Material enters airway, passes BELOW cords and not ejected out despite cough attempt by patient Pharyngeal- Thin Straw -- Pharyngeal -- Pharyngeal- Puree Delayed swallow initiation-vallecula;Delayed swallow initiation-pyriform sinuses;Reduced laryngeal elevation Pharyngeal -- Pharyngeal- Mechanical Soft -- Pharyngeal -- Pharyngeal- Regular -- Pharyngeal -- Pharyngeal- Multi-consistency -- Pharyngeal -- Pharyngeal- Pill -- Pharyngeal -- Pharyngeal Comment --  CHL IP CERVICAL ESOPHAGEAL PHASE 03/20/2016 Cervical Esophageal Phase WFL Pudding Teaspoon -- Pudding Cup -- Honey Teaspoon -- Honey Cup -- Nectar Teaspoon -- Nectar Cup -- Nectar Straw -- Thin Teaspoon -- Thin Cup -- Thin Straw -- Puree -- Mechanical Soft -- Regular -- Multi-consistency -- Pill -- Cervical Esophageal Comment -- No flowsheet data found. Royce MacadamiaLitaker, Lisa Willis 03/20/2016, 2:50 PM Breck CoonsLisa Willis Lonell FaceLitaker M.Ed CCC-SLP Pager [phone]              Mr Brain Limited Wo Contrast  Result Date: 03/20/2016 CLINICAL DATA:  83 y/o F; left facial droop and left arm flaccidity. EXAM: MRI HEAD WITHOUT CONTRAST TECHNIQUE: Axial and coronal diffusion weighted sequences were acquired. COMPARISON:  CT and CT angiogram of the  head dated 03/19/2016. FINDINGS: Focus of diffusion restriction measuring 1.6 x 0.9 x 2.1 cm (volume = 2 cm^3) centered in the right lentiform nucleus with extension into right posterior corona radiata compatible with acute infarction. Moderate brain parenchymal volume of and advanced chronic microvascular ischemic changes of white matter are present on the the 0 sequence. IMPRESSION: Acute infarction centered in the right lentiform nucleus with extension into right posterior corona radiata. These results will be called to the ordering clinician or representative by the Radiologist Assistant, and communication documented in the PACS or zVision Dashboard. Electronically Signed   By: Mitzi HansenLance  Furusawa-Stratton M.D.   On: 03/20/2016 18:45     Garth BignessKathryn Fotini Lemus, MD 03/21/2016, 8:26 AM PGY-1, [facility] FPTS Intern pager: [phone], text pages welcome

## 2016-03-21 NOTE — Progress Notes (Signed)
My charting of 03/21/16 @ 1722 was on the wrong chart.

## 2016-03-21 NOTE — Progress Notes (Signed)
Pt is now on comfort measures. Her family is very supportive and very attentative to pt's needs/wants.

## 2016-03-21 NOTE — Progress Notes (Signed)
Initial Nutrition Assessment  DOCUMENTATION CODES:  Not applicable, underweight -though patient is cachectic, daughter, with whom the pt lives, says she has been eating 3 meals/day and maintaining her weight for at least the past year, up until this past Thursday.  RD clinical opinion is not malnourished.   INTERVENTION:  Magic cup BID with meals, each supplement provides 290 kcal and 9 grams of protein  Meal preferences passed to dietary   At baseline, the pt reportedly requires a stool softener. May consider beginning a bowel regimen.   Monitor for goals of care   NUTRITION DIAGNOSIS:  Swallowing difficulty related to acute illness (CVA) as evidenced by need for D1 diet with HTL.  GOAL:  Patient will meet greater than or equal to 90% of their needs  MONITOR:  PO intake, Supplement acceptance, Diet advancement, Labs, Goals of care  REASON FOR ASSESSMENT:  Consult Poor PO  ASSESSMENT:  83 y.o female PMHx end stage dementia, tobacco abuse, alcohol abuse. Presented with L facial droop and L arm flaccidity. Worked up for right CR small infarct. Palliative following for GOC  Patient is severely demented. Pt's niece and daughter are at bedside. Daughter says the pt resides with her.   Despite the patients appearance, the daughter says that, up until last Thursday, the patient actually has maintained a good appetite and stable weight for at least the past year. She elaborates that the pt's appetite has actually improved with her dementia progression, because she is "now not as picky". She was able to eat "regular" food and thin liquids without any trouble.   The patient lost the ability to walk ~1 year ago. Lower body muscle wasting much more likely due to disuse than malnutiton  Pt's daughter reports that [name], but when she does have a BM, she will have 3 in a row. Typically patient requires a stool  softener  Family members have already talked with palliative care. They know PEG would not be indicated. Per Palliative NP note, plan is leaning towards home with hospice. If this is the plan, RD stated her diet could be changed to what the pt desired, but would need to be discussed with MD.   NFPE: severe muscle/fat wasting upper body-but at baseline  Medications: Reviewd Labs: reviewed   Recent Labs Lab 03/19/16 1318 03/19/16 1324 03/20/16 0730  NA 138 141 137  K 3.3* 3.3* 4.0  CL 107 104 106  CO2 22  --  21*  BUN 15 18 10   CREATININE 0.65 0.60 0.52  CALCIUM 9.9  --  9.3  MG  --   --  1.9  GLUCOSE 94 96 120*     Diet Order:  DIET - DYS 1 Room service appropriate? Yes; Fluid consistency: Thin  Skin:  Reviewed, no issues  Last BM:  2/27  Height:  Ht Readings from Last 1 Encounters:  03/19/16 5\' 3"  (1.6 m)   Weight:  Wt Readings from Last 1 Encounters:  03/19/16 97 lb (44 kg)   Ideal Body Weight:  52.27 kg  BMI:  Body mass index is 17.18 kg/m.  Estimated Nutritional Needs:  Kcal:  1300-1500 kcals (30-34 kcal/kg bw) Protein:  45-55 g Pro (1-1.3 g/kg bw) Fluid:  > 1.1 L fluid  EDUCATION NEEDS:  No education needs identified at this time  Debbie LouisNathan Cisneros Well RD, LDN, CNSC Clinical Nutrition Pager: [phone] 03/21/2016 3:17 PM

## 2016-03-21 NOTE — Progress Notes (Signed)
PT'S WIFE IS HERE, SHE IS UPSET THAT PT IS STILL Having H/A. Pt says that the p.o. Percocet did not relieve his h/a.   His wife says that he needs something IV for pain and wants him to have something IV on a continual basis.  He says he got Fentynl IV last noc and it helped. I will page M.D.

## 2016-03-22 ENCOUNTER — Encounter (HOSPITAL_COMMUNITY): Payer: Self-pay

## 2016-03-22 NOTE — Care Management Note (Addendum)
Case Management Note  Patient Details  Name: Debbie Cisneros MRN: [id number] Date of Birth: [birth year]  Subjective/Objective:                  Right lentiform nucleus stroke, acute Action/Plan: Discharge planning Expected Discharge Date:                  Expected Discharge Plan:  Home w Hospice Care  In-House Referral:     Discharge planning Services  CM Consult  Post Acute Care Choice:    Choice offered to:  Adult Children  DME Arranged:  Hospital bed, Overbed table, Wheelchair manual DME Agency:  Other - Comment  HH Arranged:  NA HH Agency:  [facility]  Status of Service:  Completed, signed off  If discussed at [place] of Stay Meetings, dates discussed:    Additional Comments: CM met with family to offer choice.  Family chooses [facility].  Referral called to [facility], Malachy Mood who requested I fax orders, H&P, consults, progress notes to [facility]; CM faxed requested information and received confirmation of receipt.  Contact to arrange for DME (hospital bed, over the bed tray and wheelchair) and any additional family information is [name] [phone].  CM gave family Private Duty Agency List to family who verbalized understanding this is an out of pocket expense (25-30/hour) and the day to day care is the responsibility of the family.  Family verbalized understanding non-emergency transport home is not covered by insurance and elect to transport home by family car.  NO other CM needs were communicated. [name], RN 03/22/2016, 4:58 PM

## 2016-03-22 NOTE — Progress Notes (Signed)
Family Medicine Teaching Service Daily Progress Note Intern Pager: [phone]  Patient name: Debbie Cisneros Medical record number: [id number] Date of birth: Dec 31, 1930 Age: 83 y.o. Gender: female  Primary Care Provider: Pcp Not In System Consultants: neurology, palliative Code Status: DNR  Pt Overview and Major Events to Date:  3/1 admitted for possible stroke 3/2 MRI confirms stroke, SLP rec'd dys 1 with nectar thick liquids  Assessment and Plan: Debbie Cisneros is a 83 y.o. female presenting with L facial droop and L arm flaccidity via EMS. PMH is significant for Alzheimer's dementia, hx tobacco and alcohol abuse (quit 10 years ago).  TRANSITIONED TO COMFORT CARE STATUS #Right lentiform nucleus stroke, acute: Confirmed via MRI.  - SLP recommended dysphagia 1 with nectar thick. Patient taking spoon feedings by family member this morning without cough.  - Norvasc 2.5mg  >> DCd 3/3 due to choking risk and COMFORT CARE status.  - Palliative care consulted: Changed to DNR 3/2. Family decided on Comfort Care status on 3/3. Planning on home with home hospice as soon as available.   #Alzheimer's dementia: Diagnosed at age 83, per daughter. Patient bed bound at home.  - patient wears dentures, will have daughter bring these for orientation - counseled family on being with patient as much as possible to avoid delirium   #Protein-calorie malnutrition: Patient frail and cachetic-appearing on exam with diffuse muscle atrophy. - nutrition consult  FEN/GI: dys1. Prophylaxis: Heparin sq  Disposition:home with home hospice  Subjective:  Debbie Cisneros is lying in bed this morning. Doing well. Shakes her head "no" when asked if she is in any discomfort. Family at bedside.  Objective: Temp:  [97.8 F (36.6 C)-98 F (36.7 C)] 98 F (36.7 C) (03/03 2311) Pulse Rate:  [60-73] 73 (03/03 2311) Resp:  [16] 16 (03/03 2311) BP: (172-211)/(77-86) 211/86 (03/03 2311) SpO2:  [98 %-100 %] 100 % (03/03  2311) Physical Exam: General: Cachectic female lying bed in NAD. Tracks with eyes. Able to say yes and no. Neck: supple, no JVD Cardiovascular: RRR, no murmur Respiratory: CTAB Neuro: significantly limited neuro exam. EOMI, responds yes to light touch. Unable to extend LUE actively. Deferred rest of exam due to patient discomfort.  Laboratory:  Recent Labs Lab 03/19/16 1318 03/19/16 1324 03/20/16 0730  WBC 7.2  --  7.8  HGB 12.5 13.6 12.1  HCT 36.9 40.0 34.9*  PLT 150  --  164    Recent Labs Lab 03/19/16 1318 03/19/16 1324 03/20/16 0730  NA 138 141 137  K 3.3* 3.3* 4.0  CL 107 104 106  CO2 22  --  21*  BUN 15 18 10   CREATININE 0.65 0.60 0.52  CALCIUM 9.9  --  9.3  PROT 6.7  --   --   BILITOT QUANTITY NOT SUFFICIENT, UNABLE TO PERFORM TEST  --   --   ALKPHOS 69  --   --   ALT QUANTITY NOT SUFFICIENT, UNABLE TO PERFORM TEST  --   --   AST QUANTITY NOT SUFFICIENT, UNABLE TO PERFORM TEST  --   --   GLUCOSE 94 96 120*    Imaging/Diagnostic Tests: No results found.   Kathee DeltonIan D McKeag, MD 03/22/2016, 11:56 AM PGY-3, [place] [facility] FPTS Intern pager: [phone], text pages welcome

## 2016-03-23 ENCOUNTER — Telehealth: Payer: Self-pay | Admitting: *Deleted

## 2016-03-23 MED ORDER — ORAL CARE MOUTH RINSE: 15.0000 mL | Freq: Two times a day (BID) | OROMUCOSAL | 12 refills | Status: AC

## 2016-03-23 MED ORDER — MORPHINE SULFATE 20 MG/5ML PO SOLN: 0.5000 mg | ORAL | 0 refills | Status: AC | PRN

## 2016-03-23 NOTE — Discharge Summary (Signed)
Family Medicine Teaching [facility] Discharge Summary  Patient name: Debbie Cisneros Medical record number: [id number] Date of birth: 1930-12-30 Age: 83 y.o. Gender: female Date of Admission: 03/19/2016  Date of Discharge: 03/23/16 Admitting Physician: Moses Manners, MD  Primary Care Provider: Pcp Not In System Consultants: neurology, palliative care  Indication for Hospitalization: left facial droop and left arm weakness  Discharge Diagnoses/Problem List:  HTN CVA - right lentiform nucleus Alzheimer's dementia Severe protein calorie malnutrition  Disposition: home with home hospice  Discharge Condition: stable  Discharge Exam:  General: Cachectic female lying bed in NAD. Tracks with eyes. Nonverbal this morning. Neck: supple, no JVD Cardiovascular: RRR, no murmur Respiratory: CTAB Neuro: significantly limited neuro exam. EOMI. Patient unable to participate in further neuro exam.  Brief Hospital Course:  Debbie Cisneros presented to ED with new facial droop and left arm weakness. She has a history of dementia x 23 years and has become unable to walk about 1 year prior to admission and unable to feed herself over the last 2 months. Granddaughter who is a CMA noticed facial droop on morning of admission. Code stroke was called, but patient was outside TPA window on arrival. CT head without signs of bleeding. CTA head with diffuse intracranial atherosclerosis. MRI with new right lentiform nucleus with extension into right posterior corona radiata. SLP and MBS recommended dysphagia 1 with honey thick liquids due to pharyngeal dysphagia. Additional stroke workup not ordered due to patient's underlying disease and disability. After lengthy discussion with family around the patient's desires not to be kept alive by artificial means and desire for quality of life, patient was made comfort care and diet was changed to regular for comfort. No ASA or oral meds continued given risk of choking and  change to comfort care.   Issues for Follow Up:  1. Hospice care at home. Morphine added at the recommendation of hospice.  2. Patient recommended for dysphagia 1, but given comfort care status regular diet should be continued.   Significant Procedures: head MRI with right lentiform nucleus   Significant Labs and Imaging:   Recent Labs Lab 03/19/16 1318 03/19/16 1324 03/20/16 0730  WBC 7.2  --  7.8  HGB 12.5 13.6 12.1  HCT 36.9 40.0 34.9*  PLT 150  --  164    Recent Labs Lab 03/19/16 1318 03/19/16 1324 03/20/16 0730  NA 138 141 137  K 3.3* 3.3* 4.0  CL 107 104 106  CO2 22  --  21*  GLUCOSE 94 96 120*  BUN 15 18 10   CREATININE 0.65 0.60 0.52  CALCIUM 9.9  --  9.3  MG  --   --  1.9  ALKPHOS 69  --   --   AST QUANTITY NOT SUFFICIENT, UNABLE TO PERFORM TEST  --   --   ALT QUANTITY NOT SUFFICIENT, UNABLE TO PERFORM TEST  --   --   ALBUMIN 3.9  --   --     Results/Tests Pending at Time of Discharge: none  Discharge Medications:  Allergies as of 03/23/2016   Not on File     Medication List    STOP taking these medications   multivitamin tablet   vitamin E 100 UNIT capsule     TAKE these medications   acetaminophen 325 MG tablet Commonly known as:  TYLENOL Take 650 mg by mouth every 6 (six) hours as needed for mild pain.   morphine 20 MG/5ML solution Take 0.1 mLs (0.4 mg total) by mouth  every 2 (two) hours as needed for pain. 0.25mg -0.5mg  q2H PRN   mouth rinse Liqd solution 15 mLs by Mouth Rinse route 2 (two) times daily.       Discharge Instructions: Please refer to Patient Instructions section of EMR for full details.  Patient was counseled important signs and symptoms that should prompt return to medical care, changes in medications, dietary instructions, activity restrictions, and follow up appointments.   Follow-Up Appointments: Follow-up Information    [facility] Follow up.   Contact information: [address] [place]  [id number] [phone]           Garth BignessKathryn Peggy Loge, MD 03/23/2016, 11:59 AM PGY-1, [facility]

## 2016-03-23 NOTE — Progress Notes (Signed)
Pt D/C to Hospice.

## 2016-03-23 NOTE — Care Management Note (Signed)
Case Management Note  Patient Details  Name: Debbie Cisneros MRN: [id number] Date of Birth: July 16, 1930  Subjective/Objective:                    Action/Plan: Pt discharging home with hospice care. Pt [facility] and [facility]. Family to provide transportation home. Equipment to be delivered between 1-3 pm per Lafonda Mossesiana. Family wanting to take patient home as soon as possible and Lafonda MossesDiana in agreement they don't have to wait for DME. Prescriptions and DNR sent with the family.  Expected Discharge Date:  03/23/16               Expected Discharge Plan:  Home w Hospice Care  In-House Referral:     Discharge planning Services  CM Consult  Post Acute Care Choice:    Choice offered to:  Adult Children  DME Arranged:  Hospital bed, Overbed table, Wheelchair manual DME Agency:  Other - Comment  HH Arranged:  NA HH Agency:  [facility]  Status of Service:  Completed, signed off  If discussed at [employer] Length of Stay Meetings, dates discussed:    Additional Comments:  Kermit BaloKelli F Dominque Levandowski, RN 03/23/2016, 12:17 PM

## 2016-03-23 NOTE — Care Management Important Message (Signed)
Important Message  Patient Details  Name: Debbie Horarma Cisneros MRN: [id number] Date of Birth: Feb 23, 1930   Medicare Important Message Given:  Yes    Marilena Trevathan 03/23/2016, 1:59 PM

## 2016-03-23 NOTE — Telephone Encounter (Signed)
[facility] [place] [facility] [phone] requesting clarification on two Rx written by Dr. Chanetta Marshallimberlake: 1. Morphine Please clarify dosage. 0.1 mL q 2h or 0.25-0.5 mL q 2h 2. Mouth rinse Please specify which mouth rinse and dispense states 15 mL but directions are for 15 mL bid. Please clarify. Kinnie FeilL. Neka Bise, RN, BSN

## 2016-03-23 NOTE — Consult Note (Signed)
[facility] Met with the patient's daughter, Jillene Bucks, and granddaughter, Clotilde Dieter, to discuss referral.  Reviewed hospice philosophy and scope of services that are consistent with stated Goals of Care. Medical Record and current clinical status reviewed by Dr. Brock Ra, who approved the patient for hospice services in the home. The family will be taking the patient home via car.  DME ordered for delivery today: fully electric bed with T5 mattress, full rails, OBT, WC, O2. The family will take the patient home via car and prior to delivery of DME. Thank you for this referral.  Yetta Glassman RN [place] Cell: [phone]

## 2016-03-24 NOTE — Telephone Encounter (Signed)
This patient is hospice care- so she shouldn't really be transitions of care, but I totally appreciate the help! Morphine should be 5 milligrams (not mLs) q2 hours as needed in solution form.  Any mouth rinse is just for comfort. Mouth swabs are fine. Anything the pharmacy has easily accessible is great.  If you could call the pharmacy back, that would be awesome. Let me know if they need to talk to me.

## 2016-03-24 NOTE — Telephone Encounter (Signed)
Sp[facility] with Jasmine DecemberSharon, [facility], and gave clarifications below. Kinnie FeilL. Ducatte, RN, BSN

## 2016-04-19 DEATH — deceased

## 2018-04-05 IMAGING — MR MR HEAD W/O CM
6 series · 48 of 48 positions shown · non-contrast
Comparison: CT and CT angiogram of the head dated 03/19/2016.

CLINICAL DATA: 85 y/o F; left facial droop and left arm flaccidity.

EXAM:
MRI HEAD WITHOUT CONTRAST
TECHNIQUE: Axial and coronal diffusion weighted sequences were acquired.

[Series 3: DWI · axial · 3.0mm · 1.09mm/px · z∈[-22,+110]mm · 12 of 90 slices shown (1 of 6)]
[im 1/90]
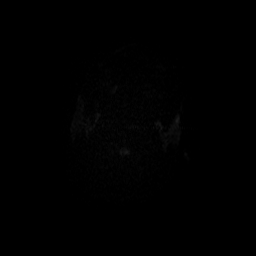
[im 9/90]
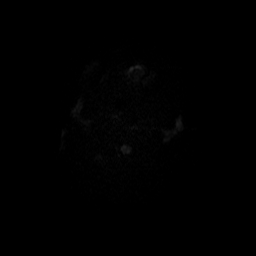
[im 17/90]
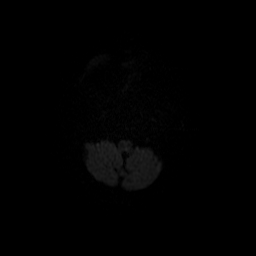
[im 25/90]
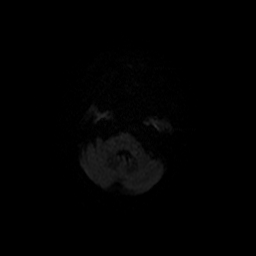
[im 33/90]
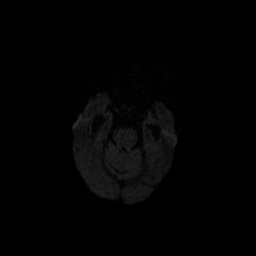
[im 41/90]
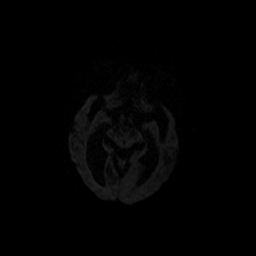
[im 49/90]
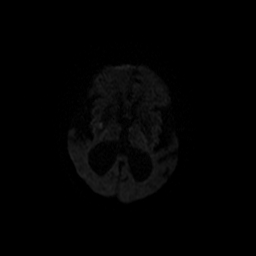
[im 57/90]
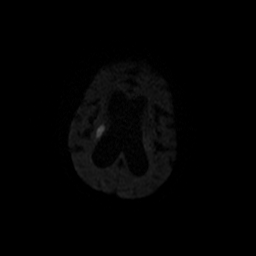
[im 65/90]
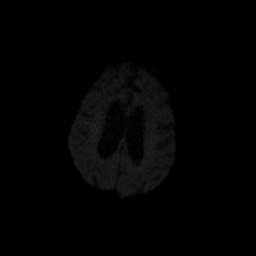
[im 73/90]
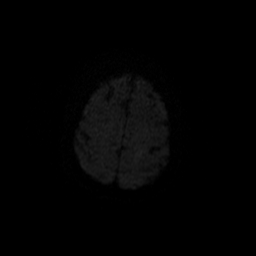
[im 81/90]
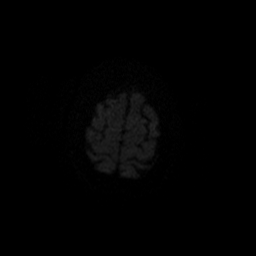
[im 90/90]
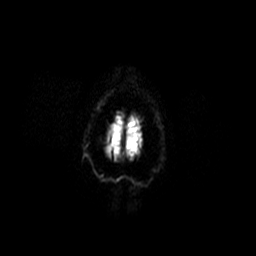

[Series 5: DWI · coronal · 5.0mm · 1.09mm/px · 9 of 66 slices shown (2 of 6)]
[im 1/66]
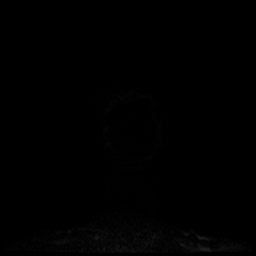
[im 9/66]
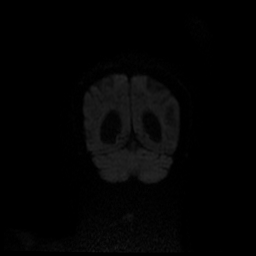
[im 17/66]
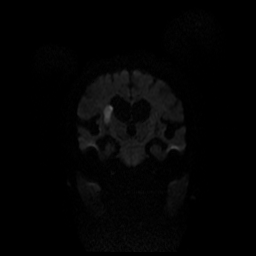
[im 25/66]
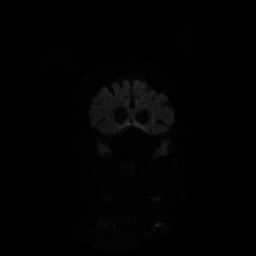
[im 33/66]
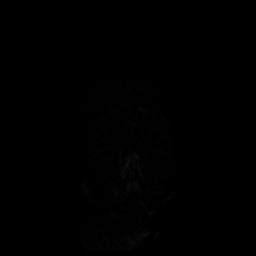
[im 41/66]
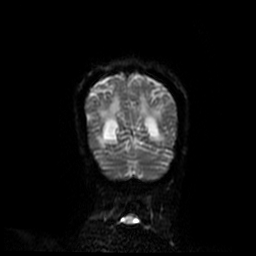
[im 49/66]
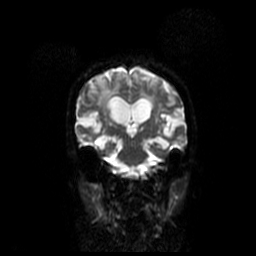
[im 57/66]
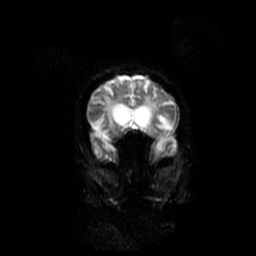
[im 66/66]
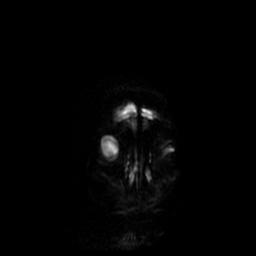

[Series 6: DWI · axial · 3.0mm · 1.09mm/px · z∈[-13,+110]mm · 11 of 84 slices shown (3 of 6)]
[im 1/84]
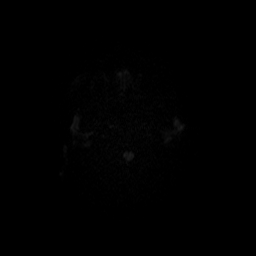
[im 9/84]
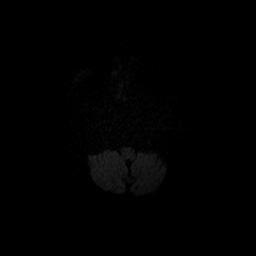
[im 17/84]
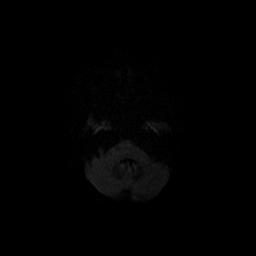
[im 25/84]
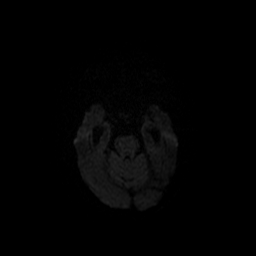
[im 34/84]
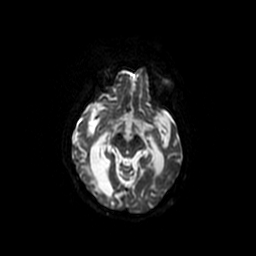
[im 42/84]
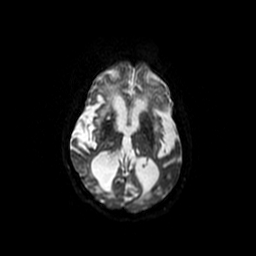
[im 50/84]
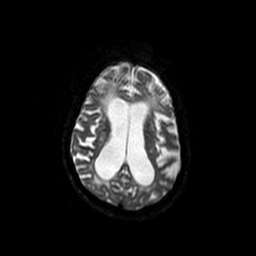
[im 59/84]
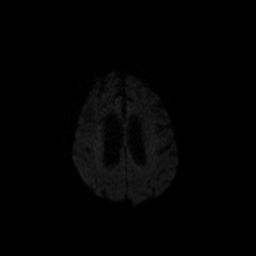
[im 67/84]
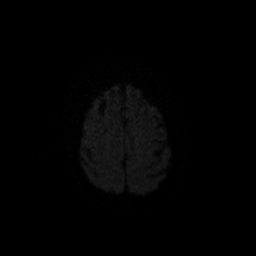
[im 75/84]
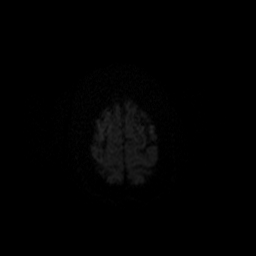
[im 84/84]
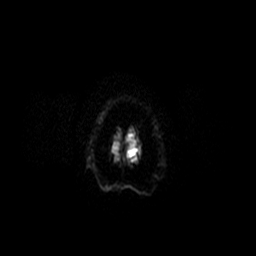

[Series 300: DWI · axial · 3.0mm · 1.09mm/px · z∈[-22,+110]mm · 6 of 45 slices shown (4 of 6)]
[im 1/45]
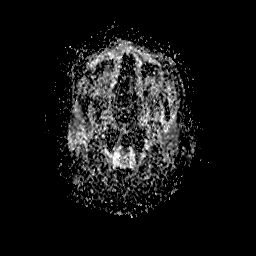
[im 9/45]
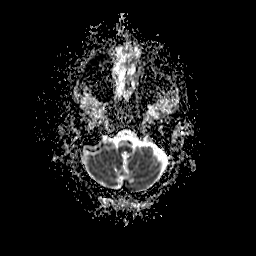
[im 18/45]
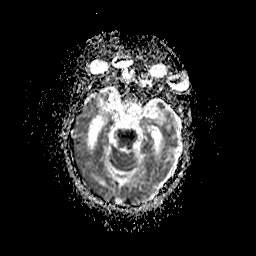
[im 27/45]
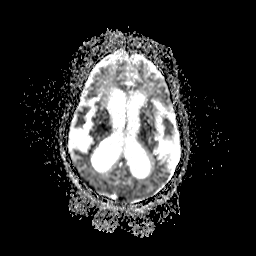
[im 36/45]
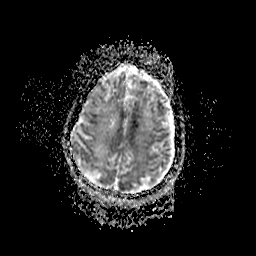
[im 45/45]
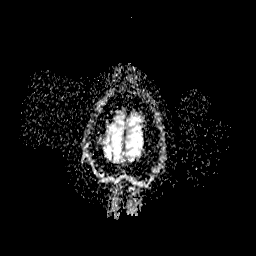

[Series 500: DWI · coronal · 5.0mm · 1.09mm/px · 4 of 32 slices shown (5 of 6)]
[im 1/32]
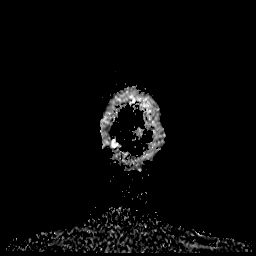
[im 11/32]
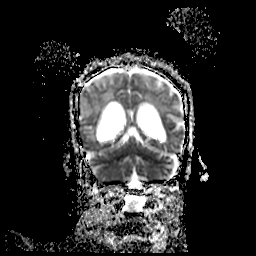
[im 21/32]
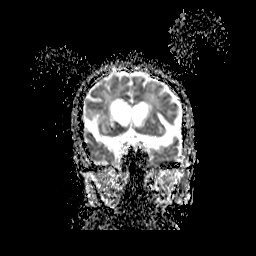
[im 32/32]
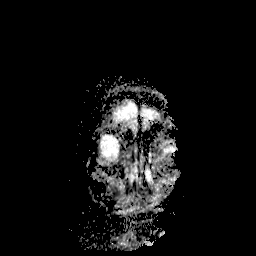

[Series 600: DWI · axial · 3.0mm · 1.09mm/px · z∈[-13,+110]mm · 6 of 42 slices shown (6 of 6)]
[im 1/42]
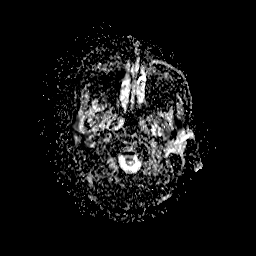
[im 9/42]
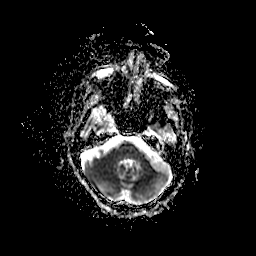
[im 17/42]
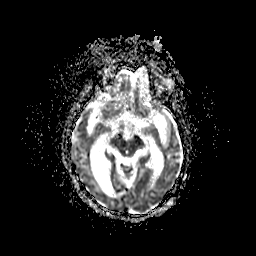
[im 25/42]
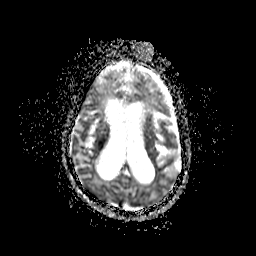
[im 33/42]
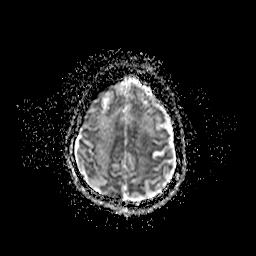
[im 42/42]
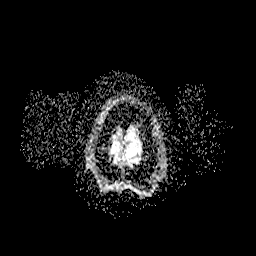

[48 of 48 positions shown; findings below may reference images not displayed]

FINDINGS: Focus of diffusion restriction measuring 1.6 x 0.9 x 2.1 cm (volume
= 2 cm^3) centered in the right lentiform nucleus with extension
into right posterior corona radiata compatible with acute
infarction. Moderate brain parenchymal volume of and advanced
chronic microvascular ischemic changes of white matter are present
on the the 0 sequence.
IMPRESSION: Acute infarction centered in the right lentiform nucleus with
extension into right posterior corona radiata.

These results will be called to the ordering clinician or
representative by the Radiologist Assistant, and communication
documented in the PACS or zVision Dashboard.

By: Rtoyota Joshjax M.D.
